# Patient Record
Sex: Male | Born: 1995 | Hispanic: Yes | Marital: Married | Smoking: Never smoker
Health system: Southern US, Community
[De-identification: ages and names within clinical notes are randomized; demographics above are authoritative.]

## PROBLEM LIST (undated history)

## (undated) DIAGNOSIS — F32A Depression, unspecified: Secondary | ICD-10-CM

## (undated) DIAGNOSIS — F329 Major depressive disorder, single episode, unspecified: Secondary | ICD-10-CM

## (undated) HISTORY — PX: KNEE SURGERY: SHX244

---
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
## 2016-08-26 ENCOUNTER — Emergency Department
Admission: EM | Admit: 2016-08-26 | Discharge: 2016-08-26 | Disposition: A | Discharge status: Home / Self Care | Payer: Medicaid Other | Attending: Emergency Medicine | Admitting: Emergency Medicine

## 2016-08-26 ENCOUNTER — Encounter: Payer: Self-pay | Admitting: Emergency Medicine

## 2016-08-26 DIAGNOSIS — J069 Acute upper respiratory infection, unspecified: Secondary | ICD-10-CM | POA: Insufficient documentation

## 2016-08-26 DIAGNOSIS — Z5321 Procedure and treatment not carried out due to patient leaving prior to being seen by health care provider: Secondary | ICD-10-CM | POA: Insufficient documentation

## 2016-08-26 NOTE — ED Triage Notes (Signed)
C/O URI symptoms over the weekend and felt nauseated this morning and had one episode of vomiting and diarrhea.  Patient states he needs a note to return to work.  AAOx3.  Skin warm and dry.  NAD

## 2016-09-10 ENCOUNTER — Emergency Department
Admission: EM | Admit: 2016-09-10 | Discharge: 2016-09-10 | Disposition: A | Discharge status: Home / Self Care | Payer: No Typology Code available for payment source | Attending: Emergency Medicine | Admitting: Emergency Medicine

## 2016-09-10 ENCOUNTER — Emergency Department: Payer: No Typology Code available for payment source

## 2016-09-10 ENCOUNTER — Encounter: Payer: Self-pay | Admitting: Emergency Medicine

## 2016-09-10 DIAGNOSIS — R51 Headache: Secondary | ICD-10-CM | POA: Diagnosis not present

## 2016-09-10 DIAGNOSIS — S199XXA Unspecified injury of neck, initial encounter: Secondary | ICD-10-CM | POA: Diagnosis present

## 2016-09-10 DIAGNOSIS — Y92410 Unspecified street and highway as the place of occurrence of the external cause: Secondary | ICD-10-CM | POA: Insufficient documentation

## 2016-09-10 DIAGNOSIS — M7918 Myalgia, other site: Secondary | ICD-10-CM

## 2016-09-10 DIAGNOSIS — Y9389 Activity, other specified: Secondary | ICD-10-CM | POA: Insufficient documentation

## 2016-09-10 DIAGNOSIS — S161XXA Strain of muscle, fascia and tendon at neck level, initial encounter: Secondary | ICD-10-CM

## 2016-09-10 DIAGNOSIS — Y999 Unspecified external cause status: Secondary | ICD-10-CM | POA: Insufficient documentation

## 2016-09-10 MED ORDER — IBUPROFEN 100 MG/5ML PO SUSP: 5.0000 mg/kg | Freq: Once | ORAL | Status: DC

## 2016-09-10 MED ORDER — CYCLOBENZAPRINE HCL 10 MG PO TABS
10.0000 mg | ORAL_TABLET | Freq: Once | ORAL | Status: AC
  Administered 2016-09-10: 10 mg via ORAL
  Filled 2016-09-10: qty 1

## 2016-09-10 MED ORDER — IBUPROFEN 400 MG PO TABS
ORAL_TABLET | ORAL | Status: AC
  Administered 2016-09-10: 400 mg via ORAL
  Filled 2016-09-10: qty 1

## 2016-09-10 MED ORDER — TRAMADOL HCL 50 MG PO TABS: 50.0000 mg | ORAL_TABLET | Freq: Four times a day (QID) | ORAL | 0 refills | Status: AC | PRN

## 2016-09-10 MED ORDER — TRAMADOL HCL 50 MG PO TABS
50.0000 mg | ORAL_TABLET | Freq: Once | ORAL | Status: AC
  Administered 2016-09-10: 50 mg via ORAL
  Filled 2016-09-10: qty 1

## 2016-09-10 MED ORDER — IBUPROFEN 400 MG PO TABS
400.0000 mg | ORAL_TABLET | Freq: Once | ORAL | Status: AC
  Administered 2016-09-10: 400 mg via ORAL
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
## 2016-10-25 ENCOUNTER — Encounter (HOSPITAL_COMMUNITY): Payer: Self-pay | Admitting: Emergency Medicine

## 2016-10-25 DIAGNOSIS — Y9372 Activity, wrestling: Secondary | ICD-10-CM | POA: Insufficient documentation

## 2016-10-25 DIAGNOSIS — Y999 Unspecified external cause status: Secondary | ICD-10-CM | POA: Insufficient documentation

## 2016-10-25 DIAGNOSIS — Y929 Unspecified place or not applicable: Secondary | ICD-10-CM | POA: Insufficient documentation

## 2016-10-25 DIAGNOSIS — M25561 Pain in right knee: Secondary | ICD-10-CM | POA: Insufficient documentation

## 2016-10-25 DIAGNOSIS — W500XXA Accidental hit or strike by another person, initial encounter: Secondary | ICD-10-CM | POA: Insufficient documentation

## 2016-10-25 NOTE — ED Notes (Signed)
Pt given ice pack

## 2016-10-25 NOTE — ED Triage Notes (Signed)
Pt st's he was play wrestling with a friend when the friend fell on his knee.  Pt c/o pain to right knee

## 2016-10-26 ENCOUNTER — Emergency Department (HOSPITAL_COMMUNITY)
Admission: EM | Admit: 2016-10-26 | Discharge: 2016-10-26 | Disposition: A | Discharge status: Home / Self Care | Payer: Medicaid Other | Attending: Emergency Medicine | Admitting: Emergency Medicine

## 2016-10-26 ENCOUNTER — Emergency Department (HOSPITAL_COMMUNITY): Payer: Medicaid Other

## 2016-10-26 DIAGNOSIS — M25561 Pain in right knee: Secondary | ICD-10-CM

## 2016-10-26 MED ORDER — IBUPROFEN 400 MG PO TABS
800.0000 mg | ORAL_TABLET | Freq: Once | ORAL | Status: AC
  Administered 2016-10-26: 800 mg via ORAL
  Filled 2016-10-26: qty 2

## 2016-10-26 MED ORDER — HYDROCODONE-ACETAMINOPHEN 5-325 MG PO TABS
1.0000 | ORAL_TABLET | Freq: Once | ORAL | Status: AC
  Administered 2016-10-26: 1 via ORAL
  Filled 2016-10-26: qty 1

## 2016-10-26 MED ORDER — IBUPROFEN 600 MG PO TABS: 600.0000 mg | ORAL_TABLET | Freq: Four times a day (QID) | ORAL | 0 refills | Status: AC | PRN

## 2016-10-26 MED ORDER — HYDROCODONE-ACETAMINOPHEN 5-325 MG PO TABS: 1.0000 | ORAL_TABLET | Freq: Four times a day (QID) | ORAL | 0 refills | Status: AC | PRN

## 2016-10-26 NOTE — ED Notes (Signed)
Pt verbalized understanding of d/c instructions and has no further questions. Pt is stable, A&Ox4, VSS.  

## 2016-10-26 NOTE — Discharge Instructions (Signed)
Take ibuprofen every 6 hours for pain. Apply ice to your knee 3-4 times per day. Wear a knee immobilizer for stability. You may take this off when showering. Use crutches to prevent from putting weight on your right leg. We advised close follow-up with an orthopedic specialist to ensure resolution of your symptoms. Call on Monday to schedule a follow-up appointment. You may take Norco as needed for severe pain. You may return for new or concerning symptoms.

## 2016-10-26 NOTE — ED Provider Notes (Signed)
[facility]-EMERGENCY DEPT Provider Note   CSN: [id number] Arrival date & time: 10/25/16  2337     History   Chief Complaint Chief Complaint  Patient presents with  . Knee Injury    HPI Edgar Stone is a 21 y.o. male.  21 year old male presents to the emergency department for evaluation of right knee pain. Symptoms began 2 hours ago while he was wrestling with his friend. He states that his friend fell on his knee and he has been experiencing pain since this time. He has tried applying an ice pack with little relief. No medications to him prior to arrival. He does report a history of knee sprain one week ago. No extremity numbness or paresthesias.      History reviewed. No pertinent past medical history.  There are no active problems to display for this patient.   History reviewed. No pertinent surgical history.     Home Medications    Prior to Admission medications   Medication Sig Start Date End Date Taking? Authorizing Provider  HYDROcodone-acetaminophen (NORCO/VICODIN) 5-325 MG tablet Take 1 tablet by mouth every 6 (six) hours as needed for severe pain. 10/26/16   Antony MaduraHumes, Marguerite Barba, PA-C  ibuprofen (ADVIL,MOTRIN) 600 MG tablet Take 1 tablet (600 mg total) by mouth every 6 (six) hours as needed. 10/26/16   Antony MaduraHumes, Sherisa Gilvin, PA-C  traMADol (ULTRAM) 50 MG tablet Take 1 tablet (50 mg total) by mouth every 6 (six) hours as needed for moderate pain. 09/10/16   Joni ReiningSmith, Ronald K, PA-C    Family History No family history on file.  Social History Social History  Substance Use Topics  . Smoking status: Never Smoker  . Smokeless tobacco: Never Used  . Alcohol use No     Allergies   Patient has no known allergies.   Review of Systems Review of Systems  Musculoskeletal: Positive for arthralgias and joint swelling.  Ten systems reviewed and are negative for acute change, except as noted in the HPI.    Physical Exam Updated Vital Signs BP 115/82 (BP Location: Left Arm)    Pulse 89   Temp 97.6 F (36.4 C) (Oral)   Resp 18   Ht 6' (1.829 m)   Wt 83 kg (183 lb)   SpO2 98%   BMI 24.82 kg/m   Physical Exam  Constitutional: He is oriented to person, place, and time. He appears well-developed and well-nourished. No distress.  Nontoxic appearing and in no acute distress  HENT:  Head: Normocephalic and atraumatic.  Eyes: Conjunctivae and EOM are normal. No scleral icterus.  Neck: Normal range of motion.  Cardiovascular: Normal rate, regular rhythm and intact distal pulses.   DP pulses 2+ in the right lower extremity  Pulmonary/Chest: Effort normal. No respiratory distress.  Respirations even and unlabored  Musculoskeletal: Normal range of motion.       Right knee: He exhibits swelling. He exhibits no effusion, no deformity, no erythema, normal alignment, no LCL laxity and no MCL laxity. Tenderness found. Medial joint line tenderness noted.  Range of motion of the right knee is preserved. There is mild swelling without effusion to the medial aspect of the knee. Tenderness to the posterior knee as well as along the medial joint line. No tenderness to the lateral joint line. No appreciable instability. No crepitus.  Neurological: He is alert and oriented to person, place, and time. He exhibits normal muscle tone. Coordination normal.  Sensation to light touch intact in the right lower extremity. Patient able to wiggle  all toes. He is able to weight-bear, but states this causes discomfort.  Skin: Skin is warm and dry. No rash noted. He is not diaphoretic. No erythema. No pallor.  Psychiatric: He has a normal mood and affect. His behavior is normal.  Nursing note and vitals reviewed.    ED Treatments / Results  Labs (all labs ordered are listed, but only abnormal results are displayed) Labs Reviewed - No data to display  EKG  EKG Interpretation None       Radiology Dg Knee Complete 4 Views Right  Result Date: 10/26/2016 CLINICAL DATA:  Play fighting  with a friend, fell and the friend landed on his knee. EXAM: RIGHT KNEE - COMPLETE 4+ VIEW COMPARISON:  None. FINDINGS: No evidence of fracture, dislocation, or joint effusion. No evidence of arthropathy or other focal bone abnormality. Soft tissues are unremarkable. IMPRESSION: Negative. Electronically Signed   By: Ellery Plunkaniel R Mitchell M.D.   On: 10/26/2016 00:24    Procedures Procedures (including critical care time)  Medications Ordered in ED Medications  ibuprofen (ADVIL,MOTRIN) tablet 800 mg (800 mg Oral Given 10/26/16 0109)  HYDROcodone-acetaminophen (NORCO/VICODIN) 5-325 MG per tablet 1 tablet (1 tablet Oral Given 10/26/16 0109)     Initial Impression / Assessment and Plan / ED Course  I have reviewed the triage vital signs and the nursing notes.  Pertinent labs & imaging results that were available during my care of the patient were reviewed by me and considered in my medical decision making (see chart for details).     21 year old male presents to the emergency department for right knee pain after his friend fell on his knee. Injury occurred approximately 12 hours prior to arrival. Patient neurovascularly intact on exam. No concern for septic joint. X-ray without findings of fracture, dislocation, or bony deformity. Suspect medial meniscal injury versus anterior cruciate ligament or PCL strain. Patient placed in knee immobilizer. Crutches given for weightbearing as tolerated. Will refer to orthopedics for follow-up. RICE and NSAIDs advised. Return precautions given. Patient discharged in stable condition with no unaddressed concerns.   Final Clinical Impressions(s) / ED Diagnoses   Final diagnoses:  Acute pain of right knee    New Prescriptions New Prescriptions   HYDROCODONE-ACETAMINOPHEN (NORCO/VICODIN) 5-325 MG TABLET    Take 1 tablet by mouth every 6 (six) hours as needed for severe pain.   IBUPROFEN (ADVIL,MOTRIN) 600 MG TABLET    Take 1 tablet (600 mg total) by mouth every 6  (six) hours as needed.     Antony MaduraHumes, Dorianna Mckiver, PA-C 10/26/16 0126    Zadie RhineWickline, Donald, MD 10/27/16 [phone]

## 2017-01-07 ENCOUNTER — Encounter: Payer: Self-pay | Admitting: Emergency Medicine

## 2017-01-07 ENCOUNTER — Emergency Department
Admission: EM | Admit: 2017-01-07 | Discharge: 2017-01-07 | Discharge status: Against Medical Advice | Payer: Medicaid Other | Attending: Student in an Organized Health Care Education/Training Program | Admitting: Student in an Organized Health Care Education/Training Program

## 2017-01-07 ENCOUNTER — Emergency Department: Payer: Medicaid Other

## 2017-01-07 DIAGNOSIS — R0602 Shortness of breath: Secondary | ICD-10-CM | POA: Insufficient documentation

## 2017-01-07 DIAGNOSIS — F419 Anxiety disorder, unspecified: Secondary | ICD-10-CM | POA: Diagnosis not present

## 2017-01-07 DIAGNOSIS — Z79899 Other long term (current) drug therapy: Secondary | ICD-10-CM | POA: Diagnosis not present

## 2017-01-07 DIAGNOSIS — R0789 Other chest pain: Secondary | ICD-10-CM | POA: Insufficient documentation

## 2017-01-07 DIAGNOSIS — H538 Other visual disturbances: Secondary | ICD-10-CM | POA: Diagnosis not present

## 2017-01-07 DIAGNOSIS — R55 Syncope and collapse: Secondary | ICD-10-CM | POA: Insufficient documentation

## 2017-01-07 DIAGNOSIS — R202 Paresthesia of skin: Secondary | ICD-10-CM | POA: Insufficient documentation

## 2017-01-07 LAB — COMPREHENSIVE METABOLIC PANEL
ALBUMIN: 4.9 g/dL (ref 3.5–5.0)
ALT: 23 U/L (ref 17–63)
ANION GAP: 10 (ref 5–15)
AST: 19 U/L (ref 15–41)
Alkaline Phosphatase: 80 U/L (ref 38–126)
BUN: 13 mg/dL (ref 6–20)
CO2: 27 mmol/L (ref 22–32)
Calcium: 9.4 mg/dL (ref 8.9–10.3)
Chloride: 101 mmol/L (ref 101–111)
Creatinine, Ser: 1.06 mg/dL (ref 0.61–1.24)
GFR calc Af Amer: >60 mL/min (ref >60)
GFR calc non Af Amer: >60 mL/min (ref >60)
GLUCOSE: 121 mg/dL — AB (ref 65–99)
POTASSIUM: 3.3 mmol/L — AB (ref 3.5–5.1)
Sodium: 138 mmol/L (ref 135–145)
Total Bilirubin: 0.9 mg/dL (ref 0.3–1.2)
Total Protein: 7.9 g/dL (ref 6.5–8.1)

## 2017-01-07 LAB — URINALYSIS, COMPLETE (UACMP) WITH MICROSCOPIC
Bacteria, UA: NONE SEEN
Bilirubin Urine: NEGATIVE
Glucose, UA: NEGATIVE mg/dL
Hgb urine dipstick: NEGATIVE
Ketones, ur: NEGATIVE mg/dL
Leukocytes, UA: NEGATIVE
Nitrite: NEGATIVE
PH: 6 (ref 5.0–8.0)
Protein, ur: NEGATIVE mg/dL
SPECIFIC GRAVITY, URINE: 1.02 (ref 1.005–1.030)
SQUAMOUS EPITHELIAL / LPF: NONE SEEN

## 2017-01-07 LAB — CBC
HCT: 49.3 % (ref 40.0–52.0)
Hemoglobin: 17.3 g/dL (ref 13.0–18.0)
MCH: 31 pg (ref 26.0–34.0)
MCHC: 35 g/dL (ref 32.0–36.0)
MCV: 88.6 fL (ref 80.0–100.0)
PLATELETS: 205 10*3/uL (ref 150–440)
RBC: 5.57 MIL/uL (ref 4.40–5.90)
RDW: 12.4 % (ref 11.5–14.5)
WBC: 14.2 10*3/uL — ABNORMAL HIGH (ref 3.8–10.6)

## 2017-01-07 LAB — TROPONIN I

## 2017-01-07 NOTE — ED Triage Notes (Addendum)
Pt reports went to get up from couch and passed out today.  Hands had been shaky feeling for couple hours prior to this.  C/o intermittent left sided mid axillary pains.  Was [facility] when passed out but not now.  Ambulatory to triage without difficulty. VSS. NAD. Pain radiates from left chest into shoulder and back per pt.

## 2017-01-07 NOTE — ED Notes (Signed)
Pt called for xray X 3.

## 2017-01-07 NOTE — ED Provider Notes (Signed)
[facility] Emergency Department Provider Note    First MD Initiated Contact with Patient 01/07/17 2130     (approximate)  I have reviewed the triage vital signs and the nursing notes.   HISTORY  Chief Complaint Loss of Consciousness    HPI Edgar Stone is a 21 y.o. male presents with chief complaint of a syncopal episode that occurred today. States it is feeling jittery and tingling all over for most of the day since he has been started on some anxiety but today started developing left-sided chest pain. States he was sitting watching TV and got up to get a drink of water but had blurry vision left-sided chest pain and then had a syncopal episode. We'll defer to the ground he started feeling better. There was associated shortness of breath but that resolved spontaneously. No fevers. No seizure-like activity. No family history of sudden cardiac death. Denies any shortness of breath at this moment but does still have some left-sided chest discomfort.   History reviewed. No pertinent past medical history. History reviewed. No pertinent family history. History reviewed. No pertinent surgical history. There are no active problems to display for this patient.     Prior to Admission medications   Medication Sig Start Date End Date Taking? Authorizing Provider  HYDROcodone-acetaminophen (NORCO/VICODIN) 5-325 MG tablet Take 1 tablet by mouth every 6 (six) hours as needed for severe pain. 10/26/16   Antony Madura, PA-C  ibuprofen (ADVIL,MOTRIN) 600 MG tablet Take 1 tablet (600 mg total) by mouth every 6 (six) hours as needed. 10/26/16   Antony Madura, PA-C  traMADol (ULTRAM) 50 MG tablet Take 1 tablet (50 mg total) by mouth every 6 (six) hours as needed for moderate pain. 09/10/16   Joni Reining, PA-C    Allergies Patient has no known allergies.    Social History Social History  Substance Use Topics  . Smoking status: Never Smoker  . Smokeless  tobacco: Never Used  . Alcohol use No    Review of Systems Patient denies headaches, rhinorrhea, blurry vision, numbness, shortness of breath, chest pain, edema, cough, abdominal pain, nausea, vomiting, diarrhea, dysuria, fevers, rashes or hallucinations unless otherwise stated above in HPI. ____________________________________________   PHYSICAL EXAM:  VITAL SIGNS: Vitals:   01/07/17 1821  BP: 129/82  Pulse: (!) 106  Resp: 16  Temp: 98.3 F (36.8 C)  SpO2: 98%    Constitutional: Alert and oriented. Well appearing and in no acute distress. Eyes: Conjunctivae are normal.  Head: Atraumatic. Nose: No congestion/rhinnorhea. Mouth/Throat: Mucous membranes are moist.   Neck: No stridor. Painless ROM.  Cardiovascular: Normal rate, regular rhythm. Grossly normal heart sounds.  Good peripheral circulation. Respiratory: Normal respiratory effort.  No retractions. Lungs CTAB. Gastrointestinal: Soft and nontender. No distention. No abdominal bruits. No CVA tenderness. Genitourinary:  Musculoskeletal: No lower extremity tenderness nor edema.  No joint effusions. Neurologic:  Normal speech and language. No gross focal neurologic deficits are appreciated. No facial droop Skin:  Skin is warm, dry and intact. No rash noted. Psychiatric: Mood and affect are normal. Speech and behavior are normal.  ____________________________________________   LABS (all labs ordered are listed, but only abnormal results are displayed)  Results for orders placed or performed during the hospital encounter of 01/07/17 (from the past 24 hour(s))  CBC     Status: Abnormal   Collection Time: 01/07/17  6:24 PM  Result Value Ref Range   WBC 14.2 (H) 3.8 - 10.6 K/uL   RBC  5.57 4.40 - 5.90 MIL/uL   Hemoglobin 17.3 13.0 - 18.0 g/dL   HCT 16.1 09.6 - 04.5 %   MCV 88.6 80.0 - 100.0 fL   MCH 31.0 26.0 - 34.0 pg   MCHC 35.0 32.0 - 36.0 g/dL   RDW 40.9 81.1 - 91.4 %   Platelets 205 150 - 440 K/uL  Troponin I      Status: None   Collection Time: 01/07/17  6:24 PM  Result Value Ref Range   Troponin I <0.03 <0.03 ng/mL  Comprehensive metabolic panel     Status: Abnormal   Collection Time: 01/07/17  6:24 PM  Result Value Ref Range   Sodium 138 135 - 145 mmol/L   Potassium 3.3 (L) 3.5 - 5.1 mmol/L   Chloride 101 101 - 111 mmol/L   CO2 27 22 - 32 mmol/L   Glucose, Bld 121 (H) 65 - 99 mg/dL   BUN 13 6 - 20 mg/dL   Creatinine, Ser 7.82 0.61 - 1.24 mg/dL   Calcium 9.4 8.9 - 95.6 mg/dL   Total Protein 7.9 6.5 - 8.1 g/dL   Albumin 4.9 3.5 - 5.0 g/dL   AST 19 15 - 41 U/L   ALT 23 17 - 63 U/L   Alkaline Phosphatase 80 38 - 126 U/L   Total Bilirubin 0.9 0.3 - 1.2 mg/dL   GFR calc non Af Amer >60 >60 mL/min   GFR calc Af Amer >60 >60 mL/min   Anion gap 10 5 - 15  Urinalysis, Complete w Microscopic     Status: Abnormal   Collection Time: 01/07/17  6:25 PM  Result Value Ref Range   Color, Urine YELLOW (A) YELLOW   APPearance CLEAR (A) CLEAR   Specific Gravity, Urine 1.020 1.005 - 1.030   pH 6.0 5.0 - 8.0   Glucose, UA NEGATIVE NEGATIVE mg/dL   Hgb urine dipstick NEGATIVE NEGATIVE   Bilirubin Urine NEGATIVE NEGATIVE   Ketones, ur NEGATIVE NEGATIVE mg/dL   Protein, ur NEGATIVE NEGATIVE mg/dL   Nitrite NEGATIVE NEGATIVE   Leukocytes, UA NEGATIVE NEGATIVE   RBC / HPF 0-5 0 - 5 RBC/hpf   WBC, UA 0-5 0 - 5 WBC/hpf   Bacteria, UA NONE SEEN NONE SEEN   Squamous Epithelial / LPF NONE SEEN NONE SEEN   Mucus PRESENT    ____________________________________________  EKG My review and personal interpretation at Time: 18:25   Indication: syncope  Rate: 105  Rhythm: sinus Axis: normal Other: normal intervals, no stemi, no wpw, no brugada ____________________________________________  RADIOLOGY  I personally reviewed all radiographic images ordered to evaluate for the above acute complaints and reviewed radiology reports and findings.  These findings were personally discussed with the patient.  Please see  medical record for radiology report.  ____________________________________________   PROCEDURES  Procedure(s) performed:  Procedures    Critical Care performed: no ____________________________________________   INITIAL IMPRESSION / ASSESSMENT AND PLAN / ED COURSE  Pertinent labs & imaging results that were available during my care of the patient were reviewed by me and considered in my medical decision making (see chart for details).  DDX: dehydration, orthostasis, uti, seizure, hocm, dysrhythmia, pe  Trig Jamisen Duerson is a 21 y.o. who presents to the ED with syncopal episode as described above. Patient is currently well-appearing. He is nonfocal neuro exam. EKG shows no evidence of preexcitation syndrome. His abdominal exam soft benign. He appears well-hydrated. May have some component of anxiety or hyperventilatory these syncopal episode but patient  is tachycardic with left-sided chest pain. I strongly recommended further workup for pulmonary embolism with d-dimer but the patient has declined this. I did relate my concern that not obtaining this blood work in the tissue ruling out PE with CT imaging could delay diagnosis of potentially life-threatening and harmful disease process. We discussed alternatives included lidocaine and other alternatives of obtaining IV access versus getting a straight stick for the blood test and then holding off on IV patient has declined this stating he rather follow up with his PCP. He demonstrates capacity understanding of the risks of not completing this workup and does demonstrate understanding of signs and symptoms to return to the ER.        ____________________________________________   FINAL CLINICAL IMPRESSION(S) / ED DIAGNOSES  Final diagnoses:  Syncope and collapse      NEW MEDICATIONS STARTED DURING THIS VISIT:  New Prescriptions   No medications on file     Note:  This document was prepared using Dragon voice recognition  software and may include unintentional dictation errors.    Willy Eddy, MD 01/07/17 2158

## 2017-01-07 NOTE — ED Notes (Signed)
Patient refusing any further services at this time. Cooperative with this RN to sign AMA paperwork. This RN and MD explained risks of leaving without further testing. Patient verbalized understanding and still refusing and wanting to leave. Information for outpatient followup provided by EDP and given to patient.

## 2017-01-07 NOTE — ED Notes (Signed)
Patient ambulatory to lobby with family with NAD noted.

## 2017-08-26 IMAGING — DX DG KNEE COMPLETE 4+V*R*
4 series · 4 of 4 positions shown · non-contrast
Comparison: None.

CLINICAL DATA: Play fighting with a friend, fell and the friend
landed on his knee.

EXAM:
RIGHT KNEE - COMPLETE 4+ VIEW

[knee ap]
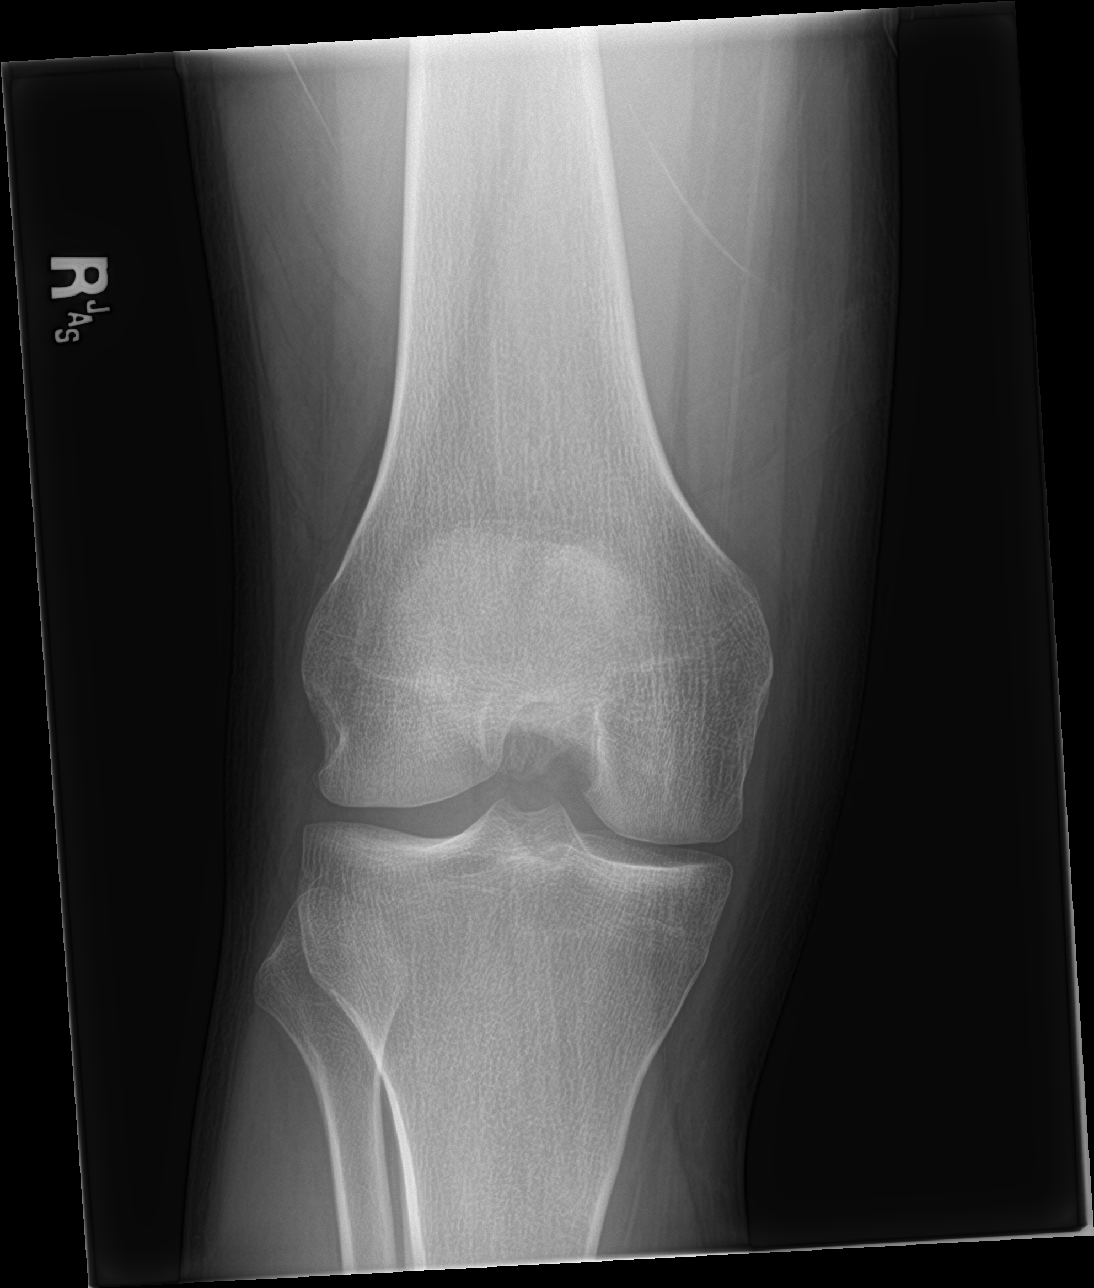

[knee lat]
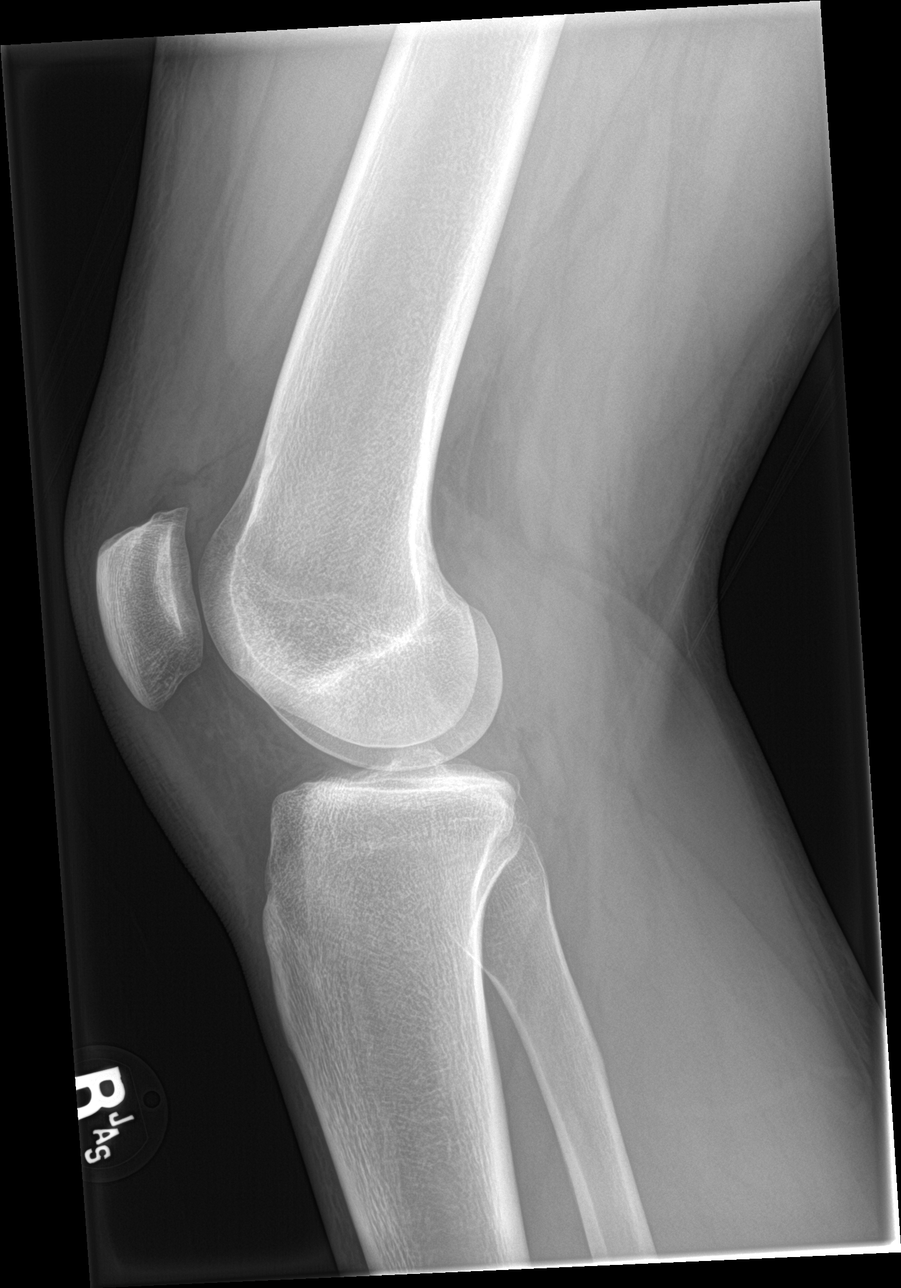

[knee obl (1 of 2)]
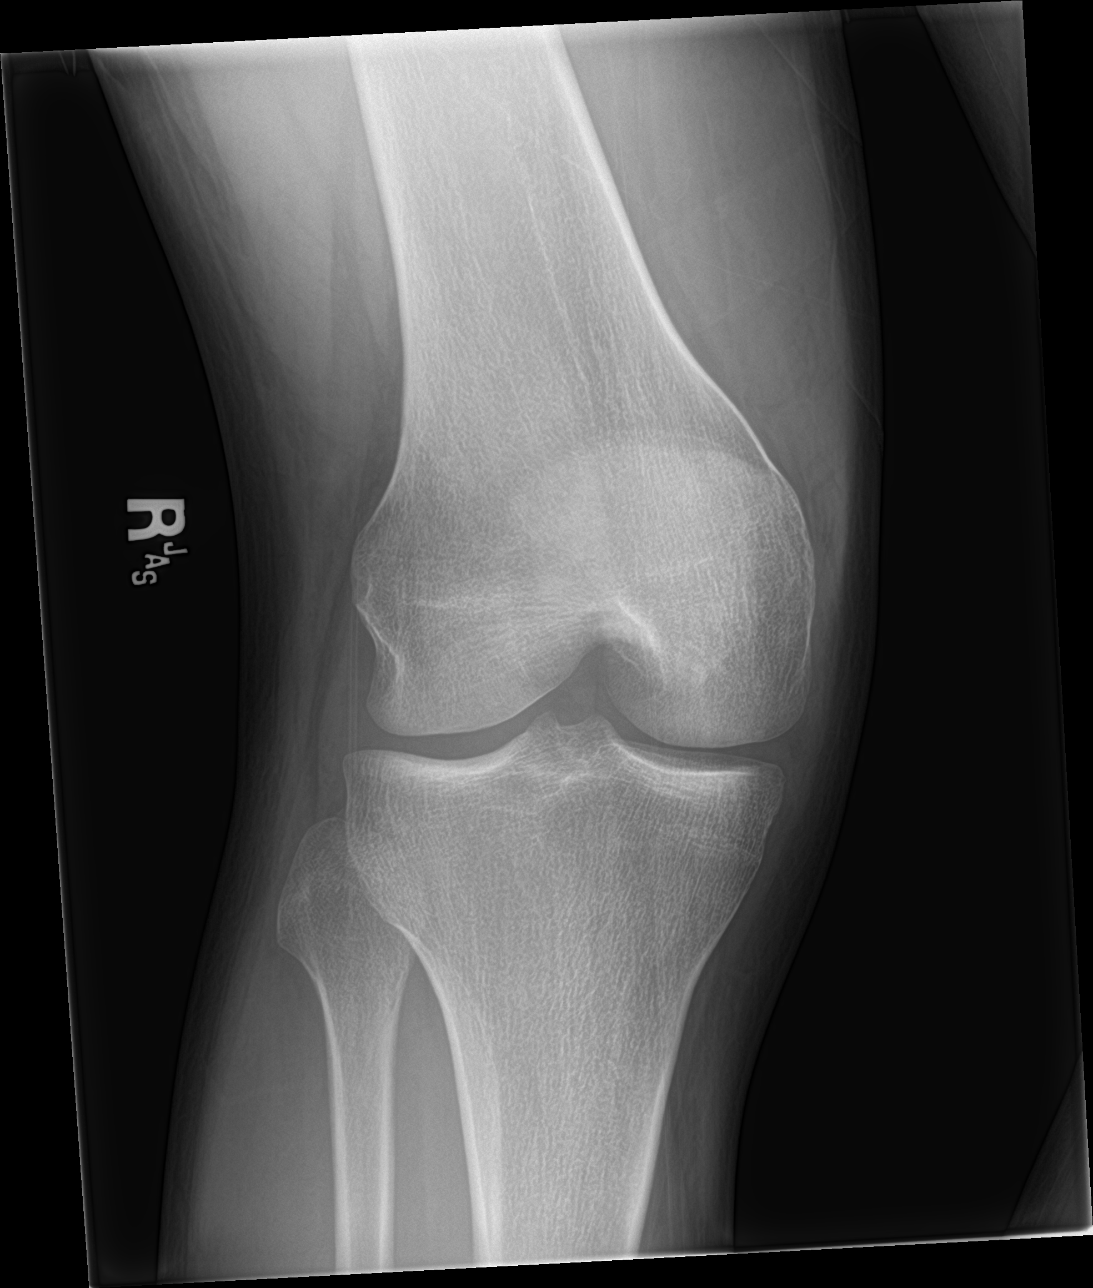

[knee obl (2 of 2)]
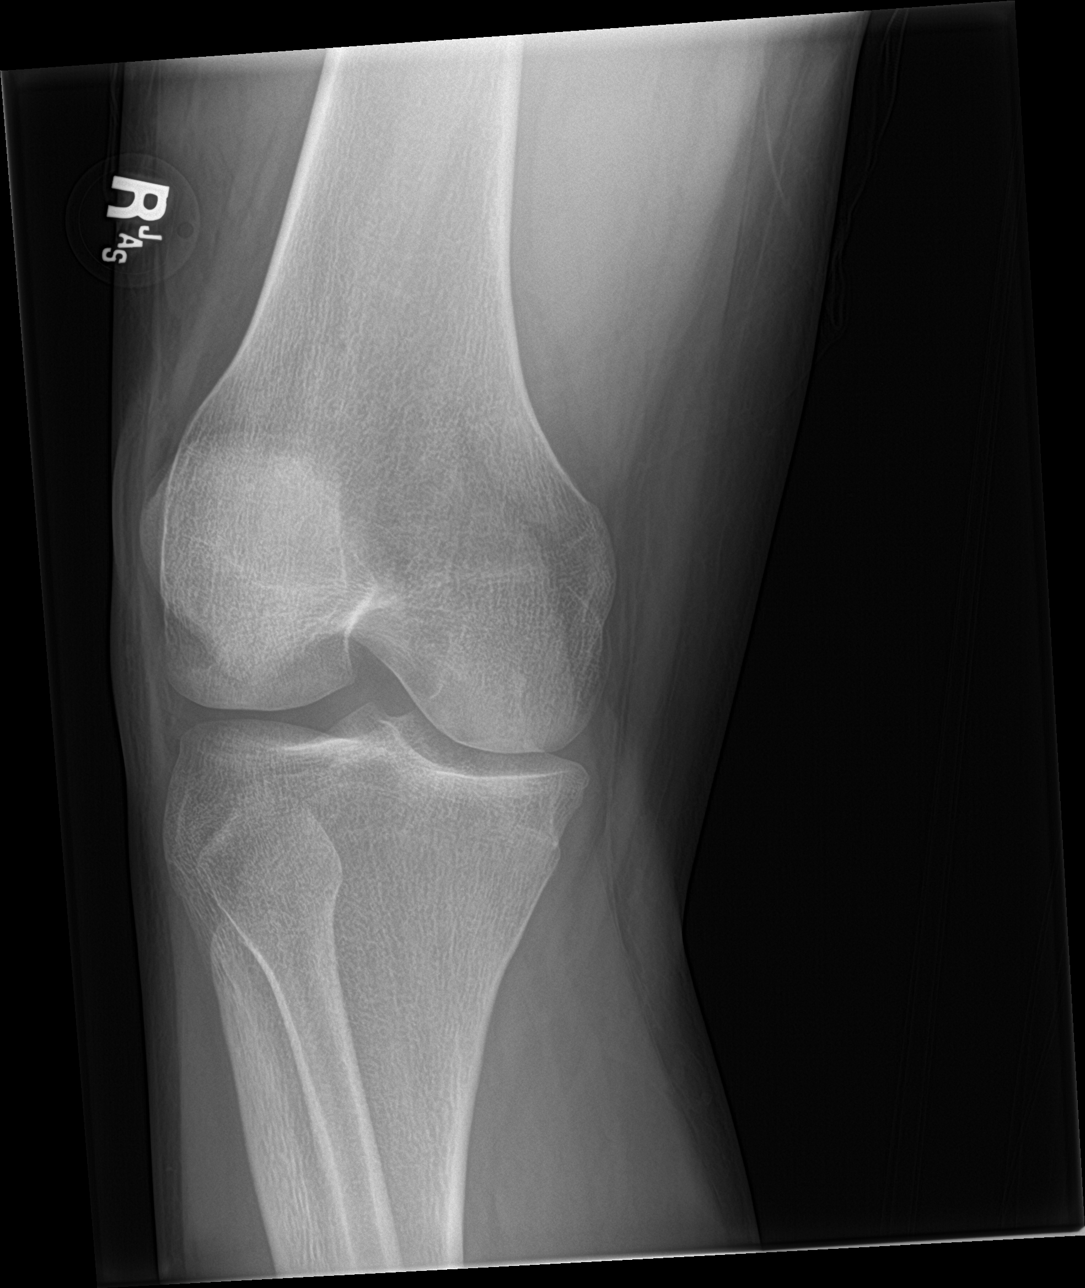

[4 of 4 positions shown; findings below may reference images not displayed]

FINDINGS: No evidence of fracture, dislocation, or joint effusion. No evidence
of arthropathy or other focal bone abnormality. Soft tissues are
unremarkable.
IMPRESSION: Negative.

## 2017-10-19 ENCOUNTER — Emergency Department
Admission: EM | Admit: 2017-10-19 | Discharge: 2017-10-19 | Disposition: A | Discharge status: Home / Self Care | Payer: Medicaid Other | Attending: Emergency Medicine | Admitting: Emergency Medicine

## 2017-10-19 ENCOUNTER — Other Ambulatory Visit: Payer: Self-pay

## 2017-10-19 DIAGNOSIS — Z79899 Other long term (current) drug therapy: Secondary | ICD-10-CM | POA: Diagnosis not present

## 2017-10-19 DIAGNOSIS — F331 Major depressive disorder, recurrent, moderate: Secondary | ICD-10-CM | POA: Diagnosis not present

## 2017-10-19 DIAGNOSIS — F1010 Alcohol abuse, uncomplicated: Secondary | ICD-10-CM

## 2017-10-19 DIAGNOSIS — F329 Major depressive disorder, single episode, unspecified: Secondary | ICD-10-CM | POA: Diagnosis present

## 2017-10-19 HISTORY — DX: Major depressive disorder, single episode, unspecified: F32.9

## 2017-10-19 HISTORY — DX: Depression, unspecified: F32.A

## 2017-10-19 LAB — URINE DRUG SCREEN, QUALITATIVE (ARMC ONLY)
AMPHETAMINES, UR SCREEN: NOT DETECTED
Benzodiazepine, Ur Scrn: NOT DETECTED
Cannabinoid 50 Ng, Ur ~~LOC~~: POSITIVE — AB
Cocaine Metabolite,Ur ~~LOC~~: POSITIVE — AB
MDMA (ECSTASY) UR SCREEN: NOT DETECTED
Methadone Scn, Ur: NOT DETECTED
Opiate, Ur Screen: NOT DETECTED
Phencyclidine (PCP) Ur S: NOT DETECTED
Tricyclic, Ur Screen: NOT DETECTED

## 2017-10-19 LAB — CBC
HEMATOCRIT: 46.4 % (ref 40.0–52.0)
HEMOGLOBIN: 16.4 g/dL (ref 13.0–18.0)
MCH: 31.9 pg (ref 26.0–34.0)
MCHC: 35.3 g/dL (ref 32.0–36.0)
MCV: 90.4 fL (ref 80.0–100.0)
Platelets: 205 10*3/uL (ref 150–440)
RBC: 5.13 MIL/uL (ref 4.40–5.90)
RDW: 13.1 % (ref 11.5–14.5)
WBC: 11.4 10*3/uL — AB (ref 3.8–10.6)

## 2017-10-19 LAB — COMPREHENSIVE METABOLIC PANEL
ALT: 28 U/L (ref 0–44)
AST: 33 U/L (ref 15–41)
Albumin: 4.6 g/dL (ref 3.5–5.0)
Alkaline Phosphatase: 84 U/L (ref 38–126)
Anion gap: 9 (ref 5–15)
BUN: 10 mg/dL (ref 6–20)
CHLORIDE: 108 mmol/L (ref 98–111)
CO2: 24 mmol/L (ref 22–32)
Calcium: 9.3 mg/dL (ref 8.9–10.3)
Creatinine, Ser: 0.83 mg/dL (ref 0.61–1.24)
GFR calc Af Amer: >60 mL/min (ref >60)
Glucose, Bld: 166 mg/dL — ABNORMAL HIGH (ref 70–99)
POTASSIUM: 3.2 mmol/L — AB (ref 3.5–5.1)
Sodium: 141 mmol/L (ref 135–145)
Total Bilirubin: 0.8 mg/dL (ref 0.3–1.2)
Total Protein: 7.4 g/dL (ref 6.5–8.1)

## 2017-10-19 LAB — ETHANOL

## 2017-10-19 LAB — SALICYLATE LEVEL: Salicylate Lvl: <7 mg/dL (ref 2.8–30.0)

## 2017-10-19 LAB — ACETAMINOPHEN LEVEL

## 2017-10-19 MED ORDER — ESCITALOPRAM OXALATE 10 MG PO TABS
10.0000 mg | ORAL_TABLET | Freq: Every day | ORAL | Status: DC
  Administered 2017-10-19: 10 mg via ORAL
  Filled 2017-10-19: qty 1

## 2017-10-19 MED ORDER — FLUOXETINE HCL 20 MG PO CAPS: 20.0000 mg | ORAL_CAPSULE | Freq: Every day | ORAL | 1 refills | Status: DC

## 2017-10-19 MED ORDER — FLUOXETINE HCL 20 MG PO CAPS: 20.0000 mg | ORAL_CAPSULE | Freq: Every day | ORAL | 1 refills | Status: AC

## 2017-10-19 NOTE — ED Notes (Signed)
Edgar Stone, [employer], [phone]  Pt requests call boss at 0730 to notified not coming in today "I'm in hospital"  Wife, Edgar FilbertMaria Stone, [phone]

## 2017-10-19 NOTE — ED Notes (Signed)
Pt given phone to use 

## 2017-10-19 NOTE — ED Provider Notes (Signed)
The patient has been evaluated at bedside by Dr. Toni Amendlapacs, psychiatry.  Patient is clinically stable.  Not felt to be a danger to self or others.  No SI or Hi.  No indication for inpatient psychiatric admission at this time.  Appropriate for continued outpatient therapy.    Willy Eddyobinson, Jairy Angulo, MD 10/19/17 1520

## 2017-10-19 NOTE — ED Provider Notes (Signed)
[facility] Emergency Department Provider Note  ____________________________________________   First MD Initiated Contact with Patient 10/19/17 [phone]     (approximate)  I have reviewed the triage vital signs and the nursing notes.   HISTORY  Chief Complaint Psychiatric Evaluation; Depression; Anxiety   HPI Edgar Stone is a 22 y.o. male who self presents the emergency department with suicidal ideation.  He has had intermittent depression and his current symptoms have been gradual onset slowly progressive.  He has no specific plan but has vague suicidal thoughts.  He is recently lost his job and his wife has been "talking to another man" which has brought him a significant amount of stress.  His symptoms are worsened by interpersonal conflict and somewhat improved with rest.  He voluntarily came to the emergency department today because "I did not want to do anything stupid".    Past Medical History:  Diagnosis Date  . Depression     There are no active problems to display for this patient.   No past surgical history on file.  Prior to Admission medications   Medication Sig Start Date End Date Taking? Authorizing Provider  hydrOXYzine (ATARAX/VISTARIL) 10 MG tablet Take 10 mg by mouth daily.   Yes [provider]  sertraline (ZOLOFT) 25 MG tablet Take 25-50 mg by mouth daily. Per Pt: Take one tablet by mouth daily, May take 2 tablets if feeling anxious.   Yes [provider]  HYDROcodone-acetaminophen (NORCO/VICODIN) 5-325 MG tablet Take 1 tablet by mouth every 6 (six) hours as needed for severe pain. Patient not taking: Reported on 10/19/2017 10/26/16   Antony MaduraHumes, Kelly, PA-C  ibuprofen (ADVIL,MOTRIN) 600 MG tablet Take 1 tablet (600 mg total) by mouth every 6 (six) hours as needed. Patient not taking: Reported on 10/19/2017 10/26/16   Antony MaduraHumes, Kelly, PA-C  traMADol (ULTRAM) 50 MG tablet Take 1 tablet (50 mg total) by mouth every 6  (six) hours as needed for moderate pain. Patient not taking: Reported on 10/19/2017 09/10/16   Joni ReiningSmith, Ronald K, PA-C    Allergies Patient has no known allergies.  No family history on file.  Social History Social History   Tobacco Use  . Smoking status: Never Smoker  . Smokeless tobacco: Never Used  Substance Use Topics  . Alcohol use: No  . Drug use: Yes    Types: Marijuana    Review of Systems Constitutional: No fever/chills Eyes: No visual changes. ENT: No sore throat. Cardiovascular: Denies chest pain. Respiratory: Denies shortness of breath. Gastrointestinal: No abdominal pain.  No nausea, no vomiting.  No diarrhea.  No constipation. Genitourinary: Negative for dysuria. Musculoskeletal: Negative for back pain. Skin: Negative for rash. Neurological: Negative for headaches, focal weakness or numbness.   ____________________________________________   PHYSICAL EXAM:  VITAL SIGNS: ED Triage Vitals [10/19/17 0428]  Enc Vitals Group     BP (!) 143/91     Pulse Rate 79     Resp 18     Temp 98 F (36.7 C)     Temp Source Oral     SpO2 99 %     Weight 188 lb (85.3 kg)     Height 6' (1.829 m)     Head Circumference      Peak Flow      Pain Score 0     Pain Loc      Pain Edu?      Excl. in GC?     Constitutional: Alert and oriented x4  sad affect nontoxic no diaphoresis Eyes: PERRL EOMI. Head: Atraumatic. Nose: No congestion/rhinnorhea. Mouth/Throat: No trismus Neck: No stridor.   Cardiovascular: Normal rate, regular rhythm. Grossly normal heart sounds.  Good peripheral circulation. Respiratory: Normal respiratory effort.  No retractions. Lungs CTAB and moving good air Gastrointestinal: Soft nontender Musculoskeletal: No lower extremity edema   Neurologic:  Normal speech and language. No gross focal neurologic deficits are appreciated. Skin:  Skin is warm, dry and intact. No rash noted. Psychiatric: Sad  affect   ____________________________________________   DIFFERENTIAL includes but not limited to  Suicidal ideation, depression, drug overdose, suicide attempt ____________________________________________   LABS (all labs ordered are listed, but only abnormal results are displayed)  Labs Reviewed  COMPREHENSIVE METABOLIC PANEL - Abnormal; Notable for the following components:      Result Value   Potassium 3.2 (*)    Glucose, Bld 166 (*)    All other components within normal limits  CBC - Abnormal; Notable for the following components:   WBC 11.4 (*)    All other components within normal limits  URINE DRUG SCREEN, QUALITATIVE ([facility] ONLY) - Abnormal; Notable for the following components:   Cocaine Metabolite,Ur [place] POSITIVE (*)    Cannabinoid 50 Ng, Ur [place] POSITIVE (*)    Barbiturates, Ur Screen   (*)    Value: Result not available. Reagent lot number recalled by manufacturer.   All other components within normal limits  ACETAMINOPHEN LEVEL - Abnormal; Notable for the following components:   Acetaminophen (Tylenol), Serum <10 (*)    All other components within normal limits  ETHANOL  SALICYLATE LEVEL    Lab work reviewed by me shows the patient is cocaine and cannabis positive __________________________________________  EKG   ____________________________________________  RADIOLOGY   ____________________________________________   PROCEDURES  Procedure(s) performed: no  Procedures  Critical Care performed: no  ____________________________________________   INITIAL IMPRESSION / ASSESSMENT AND PLAN / ED COURSE  Pertinent labs & imaging results that were available during my care of the patient were reviewed by me and considered in my medical decision making (see chart for details).   The patient self presents the emergency department with vague suicidal ideation.  In my opinion he does not meet involuntary commitment criteria as he is actively seeking help.  We  will reach out to specialist on-call.     ----------------------------------------- 7:50 AM on 10/19/2017 -----------------------------------------  Specialist on-call is recommending inpatient admission.  The patient has no indication for involuntary commitment and agrees to stay voluntary.  Recommendation made for trial of Lexapro 10 mg.  He is medically stable for psychiatric evaluation. ____________________________________________   FINAL CLINICAL IMPRESSION(S) / ED DIAGNOSES  Final diagnoses:  Moderate episode of recurrent major depressive disorder (HCC)      NEW MEDICATIONS STARTED DURING THIS VISIT:  New Prescriptions   No medications on file     Note:  This document was prepared using Dragon voice recognition software and may include unintentional dictation errors.     Merrily Brittle, MD 10/19/17 [phone]

## 2017-10-19 NOTE — ED Notes (Signed)
Dr. Toni Amendlapacs assessing patient.

## 2017-10-19 NOTE — ED Notes (Signed)
Pt dressed out into behavioral scrubs by Barbette HairNoel RN,

## 2017-10-19 NOTE — ED Notes (Signed)
Pt's wife called saying the pt called her to come get him. Wife will visit with pt per visiting hours policy. Pt advised he will not be leaving until MD determines plan of care. Pt verbalized understanding.

## 2017-10-19 NOTE — ED Notes (Signed)
This is a 22 y.o. Male; who presents to the Ed via his wife; for C/O having Depression; Anxiety; and having Suicidal thoughts; with plan to jump off a bridge; Secondary, to people talking about me; and a neighbor telling stories about me; I have really bad anxiety; I was walking around today contemplating suicide."

## 2017-10-19 NOTE — ED Notes (Signed)
Moved to BHU-7  

## 2017-10-19 NOTE — ED Notes (Signed)
Patient is speaking with S.O.C.  Dr. Mordecai MaesSanchez.

## 2017-10-19 NOTE — ED Notes (Signed)
Edgar PilesMaria Strickland Stone, wife, primary number for contact.  [phone]

## 2017-10-19 NOTE — ED Notes (Signed)
Patient dressed, ride called.

## 2017-10-19 NOTE — ED Notes (Signed)
Discharged to lobby with instructions and prescriptions waiting for wife to pick him up. Alert and calm.

## 2017-10-19 NOTE — ED Notes (Signed)
Pt refused shower at this time.

## 2017-10-19 NOTE — ED Triage Notes (Signed)
Patient having suicidal thoughts and feeling of hopelessness. Has lost his apartment, his car, as well as additional life stressors. Feels like he can't get ahead. Has been having thoughts of harming himself without plan. Patient presents with his wife and is tearful in triage while discussing his stressors.

## 2017-10-19 NOTE — Consult Note (Signed)
[facility] Face-to-Face Psychiatry Consult   Reason for Consult: Consult for this 22 year old man brought in by law enforcement after he contemplated jumping off a bridge Referring Physician: Rip Harbour Patient Identification: Edgar Stone MRN:  [id number] Principal Diagnosis: Moderate recurrent major depression ([place]) Diagnosis:   Patient Active Problem List   Diagnosis Date Noted  . Moderate recurrent major depression ([place]) [F33.1] 10/19/2017  . Alcohol abuse [F10.10] 10/19/2017    Total Time spent with patient: 1 hour  Subjective:   Edgar Stone is a 22 y.o. male patient admitted with "I had been arguing with my wife".  HPI: Patient seen chart reviewed.  Labs and vitals reviewed.  Patient was brought to the hospital after having an argument with his wife.  He says he has been more upset than the 2 of them of been arguing more recently.  He left the house in the middle the night feeling very depressed feeling worthless and negative about himself.  Walked around outside at night and stood on a bridge for a while thinking about killing himself but did not actually act on it.  He left the bridge and then called his wife on the phone and she arranged to have him brought to the hospital.  Patient says he has been depressed for several months now.  Mood stays down most of the time.  Sleep is erratic.  Appetite poor.  Energy level poor.  Denies any psychotic symptoms.  Patient has been to an outpatient provider and was prescribed citalopram and hydroxyzine and took them for a few weeks but did not feel that they were helping and just ran out of them.  He admits that he has been drinking more frequently drinking several drinks a day when he did not used to have a drinking problem.  Uses marijuana regularly which she does not see as a problem.  Used cocaine 1 time a couple days ago.  No psychotic symptoms.  Currently denies any suicidal intent or plan.  Social history: Lives with his wife.   Has a young child at home as well.  Has been working regularly.  Medical history: No significant medical problems  Substance abuse history: Patient says he uses marijuana regularly which she does not see as an issue.  Drug screen is also positive for cocaine which he says was a one-time use.  Negative screen for alcohol but he says his use has been increasing recently.  Past Psychiatric History: No previous hospitalization.  No previous suicide attempts.  Has seen a doctor and been prescribed citalopram and hydroxyzine but did not find them helpful.  No history of mania.  Risk to Self: Suicidal Ideation: Yes-Currently Present Suicidal Intent: (Denied) Is patient at risk for suicide?: No Suicidal Plan?: No-Not Currently/Within Last 6 Months Access to Means: Yes Specify Access to Suicidal Means: Access to bridges What has been your use of drugs/alcohol within the last 12 months?: daily use of marijuana, recent use of cocaine How many times?: 0 Other Self Harm Risks: denied Triggers for Past Attempts: None known Intentional Self Injurious Behavior: None Risk to Others: Homicidal Ideation: No Thoughts of Harm to Others: No Current Homicidal Intent: No Current Homicidal Plan: No Access to Homicidal Means: No Identified Victim: None identified History of harm to others?: No Assessment of Violence: None Noted Violent Behavior Description: denied Does patient have access to weapons?: No Criminal Charges Pending?: No Does patient have a court date: No Prior Inpatient Therapy: Prior Inpatient Therapy: No Prior Outpatient Therapy:  Prior Outpatient Therapy: No Does patient have an ACCT team?: No Does patient have Intensive In-House Services?  : No Does patient have [facility] services? : No Does patient have P4CC services?: No  Past Medical History:  Past Medical History:  Diagnosis Date  . Depression    No past surgical history on file. Family History: No family history on file. Family  Psychiatric  History: He says that he had an uncle who committed suicide Social History:  Social History   Substance and Sexual Activity  Alcohol Use No     Social History   Substance and Sexual Activity  Drug Use Yes  . Types: Marijuana    Social History   Socioeconomic History  . Marital status: Married    Spouse name: Not on file  . Number of children: Not on file  . Years of education: Not on file  . Highest education level: Not on file  Occupational History  . Not on file  Social Needs  . Financial resource strain: Not on file  . Food insecurity:    Worry: Not on file    Inability: Not on file  . Transportation needs:    Medical: Not on file    Non-medical: Not on file  Tobacco Use  . Smoking status: Never Smoker  . Smokeless tobacco: Never Used  Substance and Sexual Activity  . Alcohol use: No  . Drug use: Yes    Types: Marijuana  . Sexual activity: Not on file  Lifestyle  . Physical activity:    Days per week: Not on file    Minutes per session: Not on file  . Stress: Not on file  Relationships  . Social connections:    Talks on phone: Not on file    Gets together: Not on file    Attends religious service: Not on file    Active member of club or organization: Not on file    Attends meetings of clubs or organizations: Not on file    Relationship status: Not on file  Other Topics Concern  . Not on file  Social History Narrative  . Not on file   Additional Social History:    Allergies:  No Known Allergies  Labs:  Results for orders placed or performed during the hospital encounter of 10/19/17 (from the past 48 hour(s))  Urine Drug Screen, Qualitative     Status: Abnormal   Collection Time: 10/19/17  4:36 AM  Result Value Ref Range   Tricyclic, Ur Screen NONE DETECTED NONE DETECTED   Amphetamines, Ur Screen NONE DETECTED NONE DETECTED   MDMA (Ecstasy)Ur Screen NONE DETECTED NONE DETECTED   Cocaine Metabolite,Ur [place] POSITIVE (A) NONE DETECTED    Opiate, Ur Screen NONE DETECTED NONE DETECTED   Phencyclidine (PCP) Ur S NONE DETECTED NONE DETECTED   Cannabinoid 50 Ng, Ur [place] POSITIVE (A) NONE DETECTED   Barbiturates, Ur Screen (A) NONE DETECTED    Result not available. Reagent lot number recalled by manufacturer.   Benzodiazepine, Ur Scrn NONE DETECTED NONE DETECTED   Methadone Scn, Ur NONE DETECTED NONE DETECTED    Comment: (NOTE) Tricyclics + metabolites, urine    Cutoff 1000 ng/mL Amphetamines + metabolites, urine  Cutoff 1000 ng/mL MDMA (Ecstasy), urine              Cutoff 500 ng/mL Cocaine Metabolite, urine          Cutoff 300 ng/mL Opiate + metabolites, urine        Cutoff 300  ng/mL Phencyclidine (PCP), urine         Cutoff 25 ng/mL Cannabinoid, urine                 Cutoff 50 ng/mL Barbiturates + metabolites, urine  Cutoff 200 ng/mL Benzodiazepine, urine              Cutoff 200 ng/mL Methadone, urine                   Cutoff 300 ng/mL The urine drug screen provides only a preliminary, unconfirmed analytical test result and should not be used for non-medical purposes. Clinical consideration and professional judgment should be applied to any positive drug screen result due to possible interfering substances. A more specific alternate chemical method must be used in order to obtain a confirmed analytical result. Gas chromatography / mass spectrometry (GC/MS) is the preferred confirmat ory method. Performed at [facility], [address] [place], [place] [address]   Comprehensive metabolic panel     Status: Abnormal   Collection Time: 10/19/17  4:42 AM  Result Value Ref Range   Sodium 141 135 - 145 mmol/L   Potassium 3.2 (L) 3.5 - 5.1 mmol/L   Chloride 108 98 - 111 mmol/L    Comment: Please note change in reference range.   CO2 24 22 - 32 mmol/L   Glucose, Bld 166 (H) 70 - 99 mg/dL    Comment: Please note change in reference range.   BUN 10 6 - 20 mg/dL    Comment: Please note change in reference range.    Creatinine, Ser 0.83 0.61 - 1.24 mg/dL   Calcium 9.3 8.9 - 10.3 mg/dL   Total Protein 7.4 6.5 - 8.1 g/dL   Albumin 4.6 3.5 - 5.0 g/dL   AST 33 15 - 41 U/L   ALT 28 0 - 44 U/L    Comment: Please note change in reference range.   Alkaline Phosphatase 84 38 - 126 U/L   Total Bilirubin 0.8 0.3 - 1.2 mg/dL   GFR calc non Af Amer >60 >60 mL/min   GFR calc Af Amer >60 >60 mL/min    Comment: (NOTE) The eGFR has been calculated using the CKD EPI equation. This calculation has not been validated in all clinical situations. eGFR's persistently <60 mL/min signify possible Chronic Kidney Disease.    Anion gap 9 5 - 15    Comment: Performed at [facility], [address] [place], [place] [address]  Ethanol     Status: None   Collection Time: 10/19/17  4:42 AM  Result Value Ref Range   Alcohol, Ethyl (B) <10 <10 mg/dL    Comment: (NOTE) Lowest detectable limit for serum alcohol is 10 mg/dL. For medical purposes only. Performed at [facility], [address] [place], [place] [address]   cbc     Status: Abnormal   Collection Time: 10/19/17  4:42 AM  Result Value Ref Range   WBC 11.4 (H) 3.8 - 10.6 K/uL   RBC 5.13 4.40 - 5.90 MIL/uL   Hemoglobin 16.4 13.0 - 18.0 g/dL   HCT 46.4 40.0 - 52.0 %   MCV 90.4 80.0 - 100.0 fL   MCH 31.9 26.0 - 34.0 pg   MCHC 35.3 32.0 - 36.0 g/dL   RDW 13.1 11.5 - 14.5 %   Platelets 205 150 - 440 K/uL    Comment: Performed at [facility], [address] [place], [place] [address]  Acetaminophen level  Status: Abnormal   Collection Time: 10/19/17  4:42 AM  Result Value Ref Range   Acetaminophen (Tylenol), Serum <10 (L) 10 - 30 ug/mL    Comment: (NOTE) Therapeutic concentrations vary significantly. A range of 10-30 ug/mL  may be an effective concentration for many patients. However, some  are best treated at concentrations outside of this range. Acetaminophen concentrations >150 ug/mL at 4 hours after ingestion  and >50  ug/mL at 12 hours after ingestion are often associated with  toxic reactions. Performed at [facility], [address] [place], [place] [address]   Salicylate level     Status: None   Collection Time: 10/19/17  4:42 AM  Result Value Ref Range   Salicylate Lvl <6.0 2.8 - 30.0 mg/dL    Comment: Performed at [facility], [address] [place], [place] [address]    Current Facility-Administered Medications  Medication Dose Route Frequency Provider Last Rate Last Dose  . escitalopram (LEXAPRO) tablet 10 mg  10 mg Oral Daily Darel Hong, MD   10 mg at 10/19/17 8119   Current Outpatient Medications  Medication Sig Dispense Refill  . hydrOXYzine (ATARAX/VISTARIL) 10 MG tablet Take 10 mg by mouth daily.    . sertraline (ZOLOFT) 25 MG tablet Take 25-50 mg by mouth daily. Per Pt: Take one tablet by mouth daily, May take 2 tablets if feeling anxious.    [name] FLUoxetine (PROZAC) 20 MG capsule Take 1 capsule (20 mg total) by mouth daily. 30 capsule 1  . HYDROcodone-acetaminophen (NORCO/VICODIN) 5-325 MG tablet Take 1 tablet by mouth every 6 (six) hours as needed for severe pain. (Patient not taking: Reported on 10/19/2017) 7 tablet 0  . ibuprofen (ADVIL,MOTRIN) 600 MG tablet Take 1 tablet (600 mg total) by mouth every 6 (six) hours as needed. (Patient not taking: Reported on 10/19/2017) 30 tablet 0  . traMADol (ULTRAM) 50 MG tablet Take 1 tablet (50 mg total) by mouth every 6 (six) hours as needed for moderate pain. (Patient not taking: Reported on 10/19/2017) 12 tablet 0    Musculoskeletal: Strength & Muscle Tone: within normal limits Gait & Station: normal Patient leans: N/A  Psychiatric Specialty Exam: Physical Exam  Constitutional: He appears well-developed and well-nourished.  HENT:  Head: Normocephalic and atraumatic.  Eyes: Pupils are equal, round, and reactive to light. Conjunctivae are normal.  Neck: Normal range of motion.  Cardiovascular: Normal heart sounds.   Respiratory: Effort normal.  GI: Soft.  Musculoskeletal: Normal range of motion.  Neurological: He is alert.  Skin: Skin is warm and dry.  Psychiatric: His speech is normal and behavior is normal. Judgment and thought content normal. Cognition and memory are normal. He exhibits a depressed mood.    Review of Systems  Constitutional: Negative.   HENT: Negative.   Eyes: Negative.   Respiratory: Negative.   Cardiovascular: Negative.   Gastrointestinal: Negative.   Musculoskeletal: Negative.   Skin: Negative.   Neurological: Negative.   Psychiatric/Behavioral: Positive for depression and substance abuse. Negative for hallucinations, memory loss and suicidal ideas. The patient is not nervous/anxious and does not have insomnia.     Blood pressure 117/78, pulse 79, temperature 98 F (36.7 C), temperature source Oral, resp. rate 18, height 6' (1.829 m), weight 85.3 kg (188 lb), SpO2 100 %.Body mass index is 25.5 kg/m.  General Appearance: Casual  Eye Contact:  Good  Speech:  Clear and Coherent  Volume:  Normal  Mood:  Euthymic  Affect:  Constricted  Thought Process:  Goal Directed  Orientation:  Full (Time, Place, and Person)  Thought Content:  Logical  Suicidal Thoughts:  No  Homicidal Thoughts:  No  Memory:  Immediate;   Fair Recent;   Fair Remote;   Fair  Judgement:  Fair  Insight:  Fair  Psychomotor Activity:  Normal  Concentration:  Concentration: Fair  Recall:  [employer] of Knowledge:  Fair  Language:  Fair  Akathisia:  No  Handed:  Right  AIMS (if indicated):     Assets:  Communication Skills Desire for Improvement Financial Resources/Insurance Housing Physical Health Resilience Social Support  ADL's:  Intact  Cognition:  WNL  Sleep:        Treatment Plan Summary: Daily contact with patient to assess and evaluate symptoms and progress in treatment, Medication management and Plan 22 year old man with major depression of moderate severity no psychosis.  Not  currently suicidal.  Good insight and agrees to treatment plan.  Recommend starting fluoxetine 20 mg a day.  Side effects discussed.  Patient agreeable to plan.  Prescription printed.  Patient otherwise can be discharged from the emergency room and encouraged to continue following up at [facility].  Case reviewed with ER doctor and TTS and nursing  Disposition: No evidence of imminent risk to self or others at present.   Patient does not meet criteria for psychiatric inpatient admission. Supportive therapy provided about ongoing stressors. Discussed crisis plan, support from social network, calling 911, coming to the Emergency Department, and calling Suicide Hotline.  Alethia Berthold, MD 10/19/2017 3:58 PM
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
## 2018-01-18 ENCOUNTER — Other Ambulatory Visit: Payer: Self-pay | Admitting: Psychiatry

## 2018-03-08 ENCOUNTER — Other Ambulatory Visit: Payer: Self-pay | Admitting: Psychiatry

## 2019-02-14 ENCOUNTER — Emergency Department (HOSPITAL_COMMUNITY)
Admission: EM | Admit: 2019-02-14 | Discharge: 2019-02-15 | Discharge status: Against Medical Advice | Payer: Medicaid Other

## 2020-06-06 ENCOUNTER — Ambulatory Visit (HOSPITAL_COMMUNITY)
Admission: EM | Admit: 2020-06-06 | Discharge: 2020-06-06 | Disposition: A | Discharge status: Home / Self Care | Payer: No Payment, Other | Attending: Psychiatry | Admitting: Psychiatry

## 2020-06-06 ENCOUNTER — Other Ambulatory Visit: Payer: Self-pay

## 2020-06-06 DIAGNOSIS — F6381 Intermittent explosive disorder: Secondary | ICD-10-CM | POA: Diagnosis not present

## 2020-06-06 DIAGNOSIS — F411 Generalized anxiety disorder: Secondary | ICD-10-CM | POA: Diagnosis not present
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
## 2020-06-06 NOTE — BH Assessment (Signed)
Pt presented as a referral from upstairs/outpatient where he was a walk-in.  Pt triaged.  He reported significant anxiety with accompanying chest pressure over the last two weeks.  Pt stated that he has had suicidal ideation (without plan) over the last two weeks, although not currently.  Likewise he stated that when he is anxious, he has wanted to hurt people (''whoever is around me.'').  Denied current HI.  Pt endorsed visual hallucinations -- a sense that things are moving.  He also reported daily use of marijuana.  Last use was 06/05/2020.    Level of care -- urgent.

## 2020-06-06 NOTE — Discharge Summary (Signed)
Edgar Stone to be D/C'd home per NP order. Discussed with the patient and all questions fully answered. An After Visit Summary was printed and given to the patient.  Patient escorted out, and D/C home via private auto.  Dickie La  06/06/2020 10:42 AM

## 2020-06-06 NOTE — Discharge Instructions (Addendum)
Please come to [facility] (this facility) during walk in hours for appointment with psychiatrist for further medication management and for therapy.   Walk in hours are 8-11 AM Monday through Thursday for medication management.It is first come, first -serve; it is best to arrive by 7:00 AM. On Friday from 1 pm to 4 pm for therapy intake only. Please arrive by 12:00 pm as it is  first come, first -serve.   When you arrive please go upstairs for your appointment. If you are unsure of where to go, inform the front desk that you are here for a walk in appointment and they will assist you with directions upstairs.  Address:  [address], in [place], [address] Ph: [phone]   

## 2020-06-13 ENCOUNTER — Ambulatory Visit (INDEPENDENT_AMBULATORY_CARE_PROVIDER_SITE_OTHER): Payer: No Payment, Other | Admitting: Clinical

## 2020-06-13 ENCOUNTER — Other Ambulatory Visit: Payer: Self-pay

## 2020-06-13 DIAGNOSIS — F331 Major depressive disorder, recurrent, moderate: Secondary | ICD-10-CM

## 2020-06-15 NOTE — Progress Notes (Signed)
   THERAPIST PROGRESS NOTE  Session Time: 35 minutes  Participation Level: Active  Behavioral Response: CasualAlertDepressed  Type of Therapy: Individual Therapy  Treatment Goals addressed: Diagnosis: depression  Interventions: CBT  Summary:  Edgar Stone is a 25 y.o. male who presents as a walk in to [facility] for outpatient behavioral health services. Client presented oriented times five, appropriately dressed, friendly, and cooperative. Client denied current hallucinations and delusions. Client presents for initial appointment at [facility] for therapy and psychiatry services.  Client was seen on 06/06/20 at [facility] urgent care for anxiety and irritability. Client reported he previously received outpatient therapy and medication from [facility] in [place]. Client reported a history of endorsed symptoms that include "overwhelming anxiety, depressed mood, anger, and auditory/ visual hallucinations. Client stated, "I get very overwhelmed, and I don't know why". Client described his symptoms as "I don't like going out anywhere, heart pounding out of his chest, can't breathe, I feel like I can't do normal stuff because I have hard time being around other people, I'm paranoid and don't know why even when there is no reason". Client stated, "it takes a lot out of me to speak to people, I can't bring myself to speak to someone". Client did not care to elaborate on his mention of hallucinations. Client reported trauma during childhood but declined to elaborate at this time. Client reported these symptoms have negatively affected his relationship and his work history. Client reported his anger usually is geared towards his own things and not other people. Client denied any legal trouble due to anger. Client reported he has been tried on Sertraline and Zoloft in the past but did not think they were effective for him. Client reported he is unsure about family history of confirmed mental health diagnosis.  Client reported using marijuana daily. Client was screened for nutrition and pain assessment with the following SDOH:  GAD 7 : Generalized Anxiety Score 06/13/2020  Nervous, Anxious, on Edge 3  Control/stop worrying 3  Worry too much - different things 3  Trouble relaxing 3  Restless 3  Easily annoyed or irritable 3  Afraid - awful might happen 3  Total GAD 7 Score 21  Anxiety Difficulty Extremely difficult   Flowsheet Row Counselor from 06/13/2020 in [facility]  PHQ-2 Total Score 6     Flowsheet Row Counselor from 06/13/2020 in [facility]  PHQ-9 Total Score 19      Suicidal/Homicidal: Nowithout intent/plan  Therapist Response:  Therapist began the session making introduction and discussing confidentiality. Therapist engaged with the client to ask open ended questions about his biopsychosocial history. Therapist asked open ended questions about current endorsed symptoms. Therapist assessed for SDOH. Therapist used CBT to discuss goals for treatment. Therapist addressed questions and concerns. Client was scheduled for next appointments.    Plan: Return again in 5 weeks for individual therapy. Client was scheduled for psychiatric evaluation for medication management.  Diagnosis: Major depressive disorder, recurrent episode, moderate w/ anxious distress   Neena Rhymes Rosaire Cueto, LCSW 06/13/2020

## 2020-06-19 ENCOUNTER — Other Ambulatory Visit: Payer: Self-pay

## 2020-06-19 ENCOUNTER — Ambulatory Visit (INDEPENDENT_AMBULATORY_CARE_PROVIDER_SITE_OTHER): Payer: No Payment, Other | Admitting: Psychiatry

## 2020-06-19 ENCOUNTER — Encounter (HOSPITAL_COMMUNITY): Payer: Self-pay | Admitting: Psychiatry

## 2020-06-19 DIAGNOSIS — F1994 Other psychoactive substance use, unspecified with psychoactive substance-induced mood disorder: Secondary | ICD-10-CM | POA: Diagnosis not present

## 2020-06-19 DIAGNOSIS — F1210 Cannabis abuse, uncomplicated: Secondary | ICD-10-CM | POA: Insufficient documentation

## 2020-06-19 DIAGNOSIS — F12188 Cannabis abuse with other cannabis-induced disorder: Secondary | ICD-10-CM

## 2020-06-19 MED ORDER — QUETIAPINE FUMARATE 100 MG PO TABS: 100.0000 mg | ORAL_TABLET | Freq: Every day | ORAL | 2 refills | Status: AC
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
## 2020-07-01 ENCOUNTER — Encounter (HOSPITAL_COMMUNITY): Payer: Self-pay | Admitting: *Deleted

## 2020-07-01 ENCOUNTER — Ambulatory Visit (HOSPITAL_COMMUNITY)
Admission: EM | Admit: 2020-07-01 | Discharge: 2020-07-01 | Disposition: A | Discharge status: Home / Self Care | Payer: PRIVATE HEALTH INSURANCE | Attending: Student | Admitting: Student

## 2020-07-01 ENCOUNTER — Other Ambulatory Visit: Payer: Self-pay

## 2020-07-01 DIAGNOSIS — A084 Viral intestinal infection, unspecified: Secondary | ICD-10-CM | POA: Diagnosis not present

## 2020-07-01 DIAGNOSIS — G44209 Tension-type headache, unspecified, not intractable: Secondary | ICD-10-CM

## 2020-07-01 DIAGNOSIS — R591 Generalized enlarged lymph nodes: Secondary | ICD-10-CM | POA: Diagnosis not present

## 2020-07-01 MED ORDER — PREDNISONE 20 MG PO TABS: 40.0000 mg | ORAL_TABLET | Freq: Every day | ORAL | 0 refills | Status: AC

## 2020-07-01 MED ORDER — KETOROLAC TROMETHAMINE 60 MG/2ML IM SOLN
INTRAMUSCULAR | Status: AC
  Filled 2020-07-01: qty 2

## 2020-07-01 MED ORDER — ONDANSETRON 8 MG PO TBDP: 8.0000 mg | ORAL_TABLET | Freq: Three times a day (TID) | ORAL | 0 refills | Status: AC | PRN

## 2020-07-01 MED ORDER — KETOROLAC TROMETHAMINE 60 MG/2ML IM SOLN
60.0000 mg | Freq: Once | INTRAMUSCULAR | Status: AC
Start: 2020-07-01 — End: 2020-07-01
  Administered 2020-07-01: 60 mg via INTRAMUSCULAR

## 2020-07-01 NOTE — ED Provider Notes (Signed)
[facility]    CSN: [id number] Arrival date & time: 07/01/20  1625      History   Chief Complaint Chief Complaint  Patient presents with  . Knot on Neck  . Neck Pain  . Headache    HPI Edgar Stone is a 25 y.o. male presenting for multiple complaints including nausea with vomiting, lymphadenopathy, headaches.  Medical history includes substance abuse induced mood disorder, cannabis hyperemesis syndrome, alcohol abuse, depression. This patient is a poor historian. -Notes 5 days of left-sided neck pain and swollen lymph node.  States pain seems to be radiating out from the lymph node and extending towards his left ear.  Denies ear pain, hearing changes, discharge from ear, dizziness, tinnitus. Denies diarrhea, shortness of breath, chest pain, cough, congestion, facial pain, teeth pain, sore throat, loss of taste/smell. Denies back pain -Notes 3 days of nausea with vomiting, generalized crampy abdominal pain, bodyaches.  States he is vomiting 2-3 times daily and is not able to keep solid foods down.  States he is able to keep fluids down and is hydrating well by mouth.  Endorses subjective chills for which he took Tylenol 5 hours ago, states he has not taken any NSAIDs today.  Denies diarrhea and states that his last bowel movement was this morning and was normal, still passing gas.   -Few days of throbbing headache behind forehead and left cheek. Denies worst headache of life, thunderclap headache, weakness/sensation changes in arms/legs, vision changes, shortness of breath, chest pain/pressure, photophobia, phonophobia, n/v/d.     HPI  Past Medical History:  Diagnosis Date  . Depression     Patient Active Problem List   Diagnosis Date Noted  . Substance induced mood disorder (HCC) 06/19/2020  . Cannabis hyperemesis syndrome concurrent with and due to cannabis abuse (HCC) 06/19/2020  . Moderate recurrent major depression (HCC) 10/19/2017  . Alcohol abuse  10/19/2017    Past Surgical History:  Procedure Laterality Date  . KNEE SURGERY         Home Medications    Prior to Admission medications   Medication Sig Start Date End Date Taking? Authorizing Provider  ondansetron (ZOFRAN ODT) 8 MG disintegrating tablet Take 1 tablet (8 mg total) by mouth every 8 (eight) hours as needed for nausea or vomiting. 07/01/20  Yes Edgar Martini, PA-C  predniSONE (DELTASONE) 20 MG tablet Take 2 tablets (40 mg total) by mouth daily for 5 days. 07/01/20 07/06/20 Yes Edgar Martini, PA-C  FLUoxetine (PROZAC) 20 MG capsule Take 1 capsule (20 mg total) by mouth daily. 10/19/17 10/19/18  Clapacs, Jackquline Denmark, MD  HYDROcodone-acetaminophen (NORCO/VICODIN) 5-325 MG tablet Take 1 tablet by mouth every 6 (six) hours as needed for severe pain. Patient not taking: No sig reported 10/26/16   Antony Madura, PA-C  hydrOXYzine (ATARAX/VISTARIL) 10 MG tablet Take 10 mg by mouth daily.    [provider]  ibuprofen (ADVIL,MOTRIN) 600 MG tablet Take 1 tablet (600 mg total) by mouth every 6 (six) hours as needed. Patient not taking: No sig reported 10/26/16   Antony Madura, PA-C  QUEtiapine (SEROQUEL) 100 MG tablet Take 1 tablet (100 mg total) by mouth at bedtime. 06/19/20   Shanna Cisco, NP  sertraline (ZOLOFT) 25 MG tablet Take 25-50 mg by mouth daily. Per Pt: Take one tablet by mouth daily, May take 2 tablets if feeling anxious.    [provider]  traMADol (ULTRAM) 50 MG tablet Take 1 tablet (50 mg total) by  mouth every 6 (six) hours as needed for moderate pain. Patient not taking: No sig reported 09/10/16   Joni Reining, PA-C    Family History Family History  Problem Relation Age of Onset  . Diabetes Mother   . Thyroid disease Mother   . Healthy Father     Social History Social History   Tobacco Use  . Smoking status: Never Smoker  . Smokeless tobacco: Never Used  Vaping Use  . Vaping Use: Never used  Substance Use Topics  . Alcohol use: No  .  Drug use: Yes    Types: Marijuana     Allergies   Patient has no known allergies.   Review of Systems Review of Systems  Constitutional: Negative for appetite change, chills, diaphoresis, fatigue, fever and unexpected weight change.  HENT: Negative for congestion, ear pain, sinus pressure, sinus pain, sneezing, sore throat, trouble swallowing and voice change.   Eyes: Negative for photophobia, pain, discharge, redness, itching and visual disturbance.  Respiratory: Negative for cough, chest tightness and shortness of breath.   Cardiovascular: Negative for chest pain, palpitations and leg swelling.  Gastrointestinal: Positive for abdominal pain, nausea and vomiting. Negative for abdominal distention, anal bleeding, blood in stool, constipation, diarrhea and rectal pain.  Genitourinary: Negative for dysuria, flank pain, frequency and urgency.  Musculoskeletal: Positive for neck pain. Negative for back pain, gait problem, myalgias and neck stiffness.  Neurological: Positive for headaches. Negative for dizziness, tremors, seizures, syncope, facial asymmetry, speech difficulty, weakness, light-headedness and numbness.  Psychiatric/Behavioral: Negative for agitation, decreased concentration, dysphoric mood, hallucinations and suicidal ideas. The patient is not nervous/anxious.   All other systems reviewed and are negative.    Physical Exam Triage Vital Signs ED Triage Vitals  Enc Vitals Group     BP 07/01/20 1638 (!) 143/84     Pulse Rate 07/01/20 1638 78     Resp 07/01/20 1638 16     Temp 07/01/20 1638 98.3 F (36.8 C)     Temp Source 07/01/20 1638 Temporal     SpO2 07/01/20 1638 97 %     Weight --      Height --      Head Circumference --      Peak Flow --      Pain Score 07/01/20 1639 7     Pain Loc --      Pain Edu? --      Excl. in GC? --    No data found.  Updated Vital Signs BP (!) 143/84   Pulse 78   Temp 98.3 F (36.8 C) (Temporal)   Resp 16   SpO2 97%    Visual Acuity Right Eye Distance:   Left Eye Distance:   Bilateral Distance:    Right Eye Near:   Left Eye Near:    Bilateral Near:     Physical Exam Vitals reviewed.  Constitutional:      General: He is not in acute distress.    Appearance: Normal appearance. He is not ill-appearing.  HENT:     Head: Normocephalic and atraumatic.     Mouth/Throat:     Mouth: Mucous membranes are moist.     Comments: Moist mucous membranes Eyes:     Extraocular Movements: Extraocular movements intact.     Pupils: Pupils are equal, round, and reactive to light.     Comments: PERRLA, EOMI  Neck:     Meningeal: Brudzinski's sign and Kernig's sign absent.   Cardiovascular:  Rate and Rhythm: Normal rate and regular rhythm.     Heart sounds: Normal heart sounds.  Pulmonary:     Effort: Pulmonary effort is normal.     Breath sounds: Normal breath sounds. No wheezing, rhonchi or rales.  Abdominal:     General: Bowel sounds are normal. There is no distension.     Palpations: Abdomen is soft. There is no mass.     Tenderness: There is no abdominal tenderness. There is no right CVA tenderness, left CVA tenderness, guarding or rebound.  Musculoskeletal:     Cervical back: Normal range of motion and neck supple. No rigidity.  Lymphadenopathy:     Cervical: Cervical adenopathy present.     Right cervical: No superficial, deep or posterior cervical adenopathy.    Left cervical: Posterior cervical adenopathy present. No superficial or deep cervical adenopathy.  Skin:    General: Skin is warm.     Capillary Refill: Capillary refill takes less than 2 seconds.     Comments: Good skin turgor  Neurological:     General: No focal deficit present.     Mental Status: He is alert and oriented to person, place, and time. Mental status is at baseline.     Cranial Nerves: Cranial nerves are intact. No cranial nerve deficit or facial asymmetry.     Sensory: Sensation is intact. No sensory deficit.      Motor: Motor function is intact. No weakness.     Coordination: Coordination is intact. Romberg sign negative. Coordination normal.     Gait: Gait is intact. Gait normal.     Comments: CN 2-12 intact. No weakness or numbness in UEs or LEs, strength 5/5 in UEs and LEs.   Psychiatric:        Mood and Affect: Mood normal. Affect is flat.        Speech: Speech normal.        Behavior: Behavior normal.        Thought Content: Thought content normal.        Judgment: Judgment normal.      UC Treatments / Results  Labs (all labs ordered are listed, but only abnormal results are displayed) Labs Reviewed - No data to display  EKG   Radiology No results found.  Procedures Procedures (including critical care time)  Medications Ordered in UC Medications  ketorolac (TORADOL) injection 60 mg (has no administration in time range)    Initial Impression / Assessment and Plan / UC Course  I have reviewed the triage vital signs and the nursing notes.  Pertinent labs & imaging results that were available during my care of the patient were reviewed by me and considered in my medical decision making (see chart for details).     This patient is a 25 year old male presenting with viral gastroenteritis, lymphadenopathy, tension headache. Today this pt is afebrile nontachycardic nontachypneic, oxygenating well on room air, no wheezes rhonchi or rales. Neuro exam benign, negative Brudzinski.  Last dose of antipyretic was 5 hours ago. Appears well hydrated.   Zofran ODT sent for nausea and vomiting.  Recommended avoiding marijuana during this episode of acute illness as it can exacerbate nausea. Prednisone sent for lymphadenopathy.  Follow-up with PCP if this persists for 3 more weeks. Toradol administered today.  He has not taken any NSAIDs today. Tylenol and ibuprofen for symptomatic relief.  ED return precautions discussed.  This chart was dictated using voice recognition software, Dragon.  Despite the best efforts of this provider to  proofread and correct errors, errors may still occur which can change documentation meaning.  Final Clinical Impressions(s) / UC Diagnoses   Final diagnoses:  Lymphadenopathy of head and neck  Viral gastroenteritis  Tension headache     Discharge Instructions     -Take the Zofran (ondansetron) up to 3 times daily for nausea and vomiting. Dissolve one pill under your tongue or between your teeth and your cheek. -For your swollen lymph node, start the prednisone (Deltasone), 2 pills taken together in the morning for 5 days in a row.  This can give you energy, so take earlier in the day. -Because we gave you a shot of pain medication, avoid other NSAIDs today like ibuprofen and Aleve.  You can still take Tylenol, 1000 mg up to 3 times daily. -Starting tomorrow, you can take Take Tylenol 1000 mg 3 times daily, and ibuprofen 800 mg 3 times daily with food.  You can take these together, or alternate every 3-4 hours. -Seek additional medical attention immediately if your symptoms get worse, including the worst headache of your life, vision changes, weakness or sensation changes in 1 arm or 1 leg, shortness of breath, dizziness, severe back pain, etc.    ED Prescriptions    Medication Sig Dispense Auth. Provider   ondansetron (ZOFRAN ODT) 8 MG disintegrating tablet Take 1 tablet (8 mg total) by mouth every 8 (eight) hours as needed for nausea or vomiting. 20 tablet Edgar Stone, Edgar Guardiola E, PA-C   predniSONE (DELTASONE) 20 MG tablet Take 2 tablets (40 mg total) by mouth daily for 5 days. 10 tablet Edgar Stone, Hajira Verhagen E, PA-C     PDMP not reviewed this encounter.   Edgar Stone, Jye Fariss E, PA-C 07/01/20 1736

## 2020-07-01 NOTE — ED Triage Notes (Signed)
C/O "knot" to left posterior neck onset Monday.  C/O "stiffness and swelling" in posterior neck with painful head movements.  States pain progressing over past few days.  Now having some nausea, vomiting; able to keep down some PO fluids.  C/O "tingly pressure that goes around my whole head with burning going down into both my ears and throat".  Reports taking Tyl approx 2 hrs ago.

## 2020-07-01 NOTE — Discharge Instructions (Addendum)
-  Take the Zofran (ondansetron) up to 3 times daily for nausea and vomiting. Dissolve one pill under your tongue or between your teeth and your cheek. -For your swollen lymph node, start the prednisone (Deltasone), 2 pills taken together in the morning for 5 days in a row.  This can give you energy, so take earlier in the day. -Because we gave you a shot of pain medication, avoid other NSAIDs today like ibuprofen and Aleve.  You can still take Tylenol, 1000 mg up to 3 times daily. -Starting tomorrow, you can take Take Tylenol 1000 mg 3 times daily, and ibuprofen 800 mg 3 times daily with food.  You can take these together, or alternate every 3-4 hours. -Seek additional medical attention immediately if your symptoms get worse, including the worst headache of your life, vision changes, weakness or sensation changes in 1 arm or 1 leg, shortness of breath, dizziness, severe back pain, etc.

## 2020-07-03 ENCOUNTER — Other Ambulatory Visit: Payer: Self-pay

## 2020-07-03 ENCOUNTER — Ambulatory Visit
Admission: EM | Admit: 2020-07-03 | Discharge: 2020-07-03 | Disposition: A | Discharge status: Home / Self Care | Payer: PRIVATE HEALTH INSURANCE

## 2020-07-03 DIAGNOSIS — R591 Generalized enlarged lymph nodes: Secondary | ICD-10-CM | POA: Diagnosis not present

## 2020-07-03 NOTE — Discharge Instructions (Addendum)
-  Finish your prednisone as advised. -Follow-up with primary care if symptoms persist.

## 2020-07-03 NOTE — ED Triage Notes (Signed)
Pt also has c/o low back pain  And stiffness

## 2020-07-03 NOTE — ED Provider Notes (Signed)
[facility]    CSN: [id number] Arrival date & time: 07/03/20  0850      History   Chief Complaint Chief Complaint  Patient presents with  . Neck Pain    HPI Edgar Stone is a 25 y.o. male presenting with continued lymphadenopathy and headache.  Medical history includes substance abuse mood disorder, cannabis hyperemesis syndrome, alcohol abuse, moderate recurrent major depression. I last saw this patient 2 days ago for viral gastroenteritis, lymphadenopathy, tension headache.  I treated him at that time with Zofran ODT, prednisone, Toradol.  This patient is a poor historian. --Notes 7  days of left-sided neck pain and swollen lymph node, unchanged.  States pain seems to be radiating out from the lymph node and extending towards his left ear.  Has been taking the prednisone as directed. Denies ear pain, hearing changes, discharge from ear, dizziness, tinnitus. Denies diarrhea, shortness of breath, chest pain, cough, congestion, facial pain, teeth pain, sore throat, loss of taste/smell. Denies back pain -At his last visit also noted nausea with vomiting, this is completely improved on its own.  Denies nausea, vomiting, diarrhea, abdominal pain.  Last bowel movement was yesterday and was normal, denies changes in urinary frequency. -Notes intermittent lumbar paraspinous muscle tenderness, unchanged.  Denies trauma, overuse, pain radiating down legs, weakness or sensation changes in legs.Denies pain shooting down legs, denies numbness in arms/legs, denies weakness in arms/legs, denies saddle anesthesia, denies bowel/bladder incontinence.      HPI  Past Medical History:  Diagnosis Date  . Depression     Patient Active Problem List   Diagnosis Date Noted  . Substance induced mood disorder (HCC) 06/19/2020  . Cannabis hyperemesis syndrome concurrent with and due to cannabis abuse (HCC) 06/19/2020  . Moderate recurrent major depression (HCC) 10/19/2017  . Alcohol  abuse 10/19/2017    Past Surgical History:  Procedure Laterality Date  . KNEE SURGERY         Home Medications    Prior to Admission medications   Medication Sig Start Date End Date Taking? Authorizing Provider  FLUoxetine (PROZAC) 20 MG capsule Take 1 capsule (20 mg total) by mouth daily. 10/19/17 10/19/18  Clapacs, Jackquline Denmark, MD  HYDROcodone-acetaminophen (NORCO/VICODIN) 5-325 MG tablet Take 1 tablet by mouth every 6 (six) hours as needed for severe pain. Patient not taking: No sig reported 10/26/16   Antony Madura, PA-C  hydrOXYzine (ATARAX/VISTARIL) 10 MG tablet Take 10 mg by mouth daily.    [provider]  ibuprofen (ADVIL,MOTRIN) 600 MG tablet Take 1 tablet (600 mg total) by mouth every 6 (six) hours as needed. Patient not taking: No sig reported 10/26/16   Antony Madura, PA-C  ondansetron (ZOFRAN ODT) 8 MG disintegrating tablet Take 1 tablet (8 mg total) by mouth every 8 (eight) hours as needed for nausea or vomiting. 07/01/20   Rhys Martini, PA-C  predniSONE (DELTASONE) 20 MG tablet Take 2 tablets (40 mg total) by mouth daily for 5 days. 07/01/20 07/06/20  Rhys Martini, PA-C  QUEtiapine (SEROQUEL) 100 MG tablet Take 1 tablet (100 mg total) by mouth at bedtime. 06/19/20   Shanna Cisco, NP  sertraline (ZOLOFT) 25 MG tablet Take 25-50 mg by mouth daily. Per Pt: Take one tablet by mouth daily, May take 2 tablets if feeling anxious.    [provider]  traMADol (ULTRAM) 50 MG tablet Take 1 tablet (50 mg total) by mouth every 6 (six) hours as needed for moderate pain. Patient not taking:  No sig reported 09/10/16   Joni Reining, PA-C    Family History Family History  Problem Relation Age of Onset  . Diabetes Mother   . Thyroid disease Mother   . Healthy Father     Social History Social History   Tobacco Use  . Smoking status: Never Smoker  . Smokeless tobacco: Never Used  Vaping Use  . Vaping Use: Never used  Substance Use Topics  . Alcohol use: No  .  Drug use: Yes    Types: Marijuana     Allergies   Patient has no known allergies.   Review of Systems Review of Systems  Constitutional: Negative for appetite change, chills, diaphoresis, fatigue, fever and unexpected weight change.  HENT: Negative for congestion, ear pain, sinus pressure, sinus pain, sneezing, sore throat, trouble swallowing and voice change.   Eyes: Negative for photophobia, pain, discharge, redness, itching and visual disturbance.  Respiratory: Negative for cough, chest tightness and shortness of breath.   Cardiovascular: Negative for chest pain, palpitations and leg swelling.  Gastrointestinal: Negative for abdominal distention, abdominal pain, anal bleeding, blood in stool, constipation, diarrhea, nausea, rectal pain and vomiting.  Genitourinary: Negative for dysuria, flank pain, frequency and urgency.  Musculoskeletal: Positive for back pain. Negative for gait problem, myalgias, neck pain and neck stiffness.  Neurological: Negative for dizziness, tremors, seizures, syncope, facial asymmetry, speech difficulty, weakness, light-headedness, numbness and headaches.  Psychiatric/Behavioral: Negative for agitation, decreased concentration, dysphoric mood, hallucinations and suicidal ideas. The patient is not nervous/anxious.   All other systems reviewed and are negative.    Physical Exam Triage Vital Signs ED Triage Vitals  Enc Vitals Group     BP      Pulse      Resp      Temp      Temp src      SpO2      Weight      Height      Head Circumference      Peak Flow      Pain Score      Pain Loc      Pain Edu?      Excl. in GC?    No data found.  Updated Vital Signs BP 121/77   Pulse 74   Temp 98.4 F (36.9 C)   Resp 18   SpO2 97%   Visual Acuity Right Eye Distance:   Left Eye Distance:   Bilateral Distance:    Right Eye Near:   Left Eye Near:    Bilateral Near:     Physical Exam Vitals reviewed.  Constitutional:      General: Edgar Stone is not  in acute distress.    Appearance: Normal appearance. Edgar Stone is not ill-appearing.  HENT:     Head: Normocephalic and atraumatic.     Mouth/Throat:     Mouth: Mucous membranes are moist.     Comments: Moist mucous membranes Eyes:     Extraocular Movements: Extraocular movements intact.     Pupils: Pupils are equal, round, and reactive to light.  Cardiovascular:     Rate and Rhythm: Normal rate and regular rhythm.     Heart sounds: Normal heart sounds.  Pulmonary:     Effort: Pulmonary effort is normal.     Breath sounds: Normal breath sounds and air entry. No wheezing, rhonchi or rales.  Abdominal:     General: Bowel sounds are normal. There is no distension.     Palpations: Abdomen is soft.  There is no mass.     Tenderness: There is no abdominal tenderness. There is no right CVA tenderness, left CVA tenderness, guarding or rebound.     Comments: No bowel or bladder incontinence.  Musculoskeletal:     Cervical back: Normal range of motion and neck supple. No swelling, deformity, signs of trauma, rigidity, spasms, tenderness, bony tenderness or crepitus. No pain with movement.     Thoracic back: No swelling, deformity, signs of trauma, spasms, tenderness or bony tenderness. Normal range of motion. No scoliosis.     Lumbar back: No swelling, deformity, signs of trauma, spasms, tenderness or bony tenderness. Normal range of motion. Negative right straight leg raise test and negative left straight leg raise test. No scoliosis.     Comments: Strength 5/5 in UEs and LEs. Absolutely no paraspinous or spinous tendernes or deformity.  Lymphadenopathy:     Cervical: Cervical adenopathy present.     Left cervical: Deep cervical adenopathy present.     Comments: Lymphadenopathy is improved from 2 days ago.  Skin:    General: Skin is warm.     Capillary Refill: Capillary refill takes less than 2 seconds.     Comments: Good skin turgor  Neurological:     General: No focal deficit present.      Mental Status: Edgar Stone is alert and oriented to person, place, and time. Mental status is at baseline.     Cranial Nerves: Cranial nerves are intact. No cranial nerve deficit or facial asymmetry.     Sensory: Sensation is intact. No sensory deficit.     Motor: Motor function is intact. No weakness.     Coordination: Coordination is intact. Coordination normal.     Gait: Gait is intact. Gait normal.     Comments: CN 2-12 intact. No weakness or numbness in UEs or LEs.  Psychiatric:        Mood and Affect: Mood normal.        Behavior: Behavior normal.        Thought Content: Thought content normal.        Judgment: Judgment normal.   Negative Brudzinski, kernig   UC Treatments / Results  Labs (all labs ordered are listed, but only abnormal results are displayed) Labs Reviewed - No data to display  EKG   Radiology No results found.  Procedures Procedures (including critical care time)  Medications Ordered in UC Medications - No data to display  Initial Impression / Assessment and Plan / UC Course  I have reviewed the triage vital signs and the nursing notes.  Pertinent labs & imaging results that were available during my care of the patient were reviewed by me and considered in my medical decision making (see chart for details).     This patient is a 25 year old male presenting with lymphadenopathy that is improving on course of prednisone. Today this pt is afebrile nontachycardic nontachypneic, oxygenating well on room air, no wheezes rhonchi or rales. Benign neuro exam.  No red flag symptoms.  Continue current regimen.  Follow-up with primary care.  ED return precautions discussed.  I last saw this patient 2 days ago for viral gastroenteritis, lymphadenopathy, tension headache.  I treated him at that time with Zofran ODT, prednisone, Toradol at that time.  Final Clinical Impressions(s) / UC Diagnoses   Final diagnoses:  Lymphadenopathy     Discharge Instructions      -Finish your prednisone as advised. -Follow-up with primary care if symptoms persist.    ED  Prescriptions    None     PDMP not reviewed this encounter.   Rhys MartiniGraham, Tahesha Skeet E, PA-C 07/03/20 [phone]

## 2020-07-03 NOTE — ED Triage Notes (Signed)
Pt returns for increasing swelling in neck that began Monday , was seen on 4/3 for same

## 2020-07-24 ENCOUNTER — Ambulatory Visit (HOSPITAL_COMMUNITY): Payer: No Payment, Other | Admitting: Clinical

## 2020-08-07 ENCOUNTER — Ambulatory Visit (HOSPITAL_COMMUNITY): Payer: Self-pay | Admitting: Clinical

## 2020-08-22 ENCOUNTER — Ambulatory Visit (HOSPITAL_COMMUNITY): Payer: No Payment, Other | Admitting: Psychiatry

## 2020-09-02 NOTE — Progress Notes (Signed)
Patient did not show for appointment.   

## 2020-09-03 ENCOUNTER — Other Ambulatory Visit: Payer: Self-pay

## 2020-09-03 ENCOUNTER — Encounter: Payer: Self-pay | Admitting: Family

## 2020-09-03 DIAGNOSIS — Z7689 Persons encountering health services in other specified circumstances: Secondary | ICD-10-CM

## 2020-10-01 ENCOUNTER — Other Ambulatory Visit: Payer: Self-pay

## 2020-10-01 ENCOUNTER — Encounter (HOSPITAL_COMMUNITY): Payer: Self-pay | Admitting: Emergency Medicine

## 2020-10-01 ENCOUNTER — Emergency Department (HOSPITAL_COMMUNITY)
Admission: EM | Admit: 2020-10-01 | Discharge: 2020-10-01 | Disposition: A | Discharge status: Home / Self Care | Payer: Self-pay | Attending: Emergency Medicine | Admitting: Emergency Medicine

## 2020-10-01 ENCOUNTER — Emergency Department (HOSPITAL_COMMUNITY): Payer: Self-pay

## 2020-10-01 DIAGNOSIS — Y9389 Activity, other specified: Secondary | ICD-10-CM | POA: Insufficient documentation

## 2020-10-01 DIAGNOSIS — X500XXA Overexertion from strenuous movement or load, initial encounter: Secondary | ICD-10-CM | POA: Insufficient documentation

## 2020-10-01 DIAGNOSIS — Z79899 Other long term (current) drug therapy: Secondary | ICD-10-CM | POA: Insufficient documentation

## 2020-10-01 DIAGNOSIS — Y990 Civilian activity done for income or pay: Secondary | ICD-10-CM | POA: Insufficient documentation

## 2020-10-01 DIAGNOSIS — S93401A Sprain of unspecified ligament of right ankle, initial encounter: Secondary | ICD-10-CM | POA: Insufficient documentation

## 2020-10-01 NOTE — ED Provider Notes (Signed)
[facility]-EMERGENCY DEPT Provider Note   CSN: [id number] Arrival date & time: 10/01/20  3614     History No chief complaint on file.   Edgar Stone is a 25 y.o. male.  Patient is a 25 year old male presenting today with complaints of right ankle pain.  1 week ago he was power washing when he stepped in a hole and his ankle twisted outward and popped.  He thought it would just get better but over the last week he has had worsening pain in the right side of the ankle every time he tries to walk.  It is improved with rest and if he takes Tylenol and ibuprofen but then comes back when he starts walking again.  He has no pain anywhere else.  No numbness or tingling in his toes.  The history is provided by the patient.      Past Medical History:  Diagnosis Date   Depression     Patient Active Problem List   Diagnosis Date Noted   Substance induced mood disorder (HCC) 06/19/2020   Cannabis hyperemesis syndrome concurrent with and due to cannabis abuse (HCC) 06/19/2020   Moderate recurrent major depression (HCC) 10/19/2017   Alcohol abuse 10/19/2017    Past Surgical History:  Procedure Laterality Date   KNEE SURGERY         Family History  Problem Relation Age of Onset   Diabetes Mother    Thyroid disease Mother    Healthy Father     Social History   Tobacco Use   Smoking status: Never   Smokeless tobacco: Never  Vaping Use   Vaping Use: Never used  Substance Use Topics   Alcohol use: No   Drug use: Yes    Types: Marijuana    Home Medications Prior to Admission medications   Medication Sig Start Date End Date Taking? Authorizing Provider  FLUoxetine (PROZAC) 20 MG capsule Take 1 capsule (20 mg total) by mouth daily. 10/19/17 10/19/18  Clapacs, Jackquline Denmark, MD  HYDROcodone-acetaminophen (NORCO/VICODIN) 5-325 MG tablet Take 1 tablet by mouth every 6 (six) hours as needed for severe pain. Patient not taking: No sig reported 10/26/16    Antony Madura, PA-C  hydrOXYzine (ATARAX/VISTARIL) 10 MG tablet Take 10 mg by mouth daily.    [provider]  ibuprofen (ADVIL,MOTRIN) 600 MG tablet Take 1 tablet (600 mg total) by mouth every 6 (six) hours as needed. Patient not taking: No sig reported 10/26/16   Antony Madura, PA-C  ondansetron (ZOFRAN ODT) 8 MG disintegrating tablet Take 1 tablet (8 mg total) by mouth every 8 (eight) hours as needed for nausea or vomiting. 07/01/20   Rhys Martini, PA-C  QUEtiapine (SEROQUEL) 100 MG tablet Take 1 tablet (100 mg total) by mouth at bedtime. 06/19/20   Shanna Cisco, NP  sertraline (ZOLOFT) 25 MG tablet Take 25-50 mg by mouth daily. Per Pt: Take one tablet by mouth daily, May take 2 tablets if feeling anxious.    [provider]  traMADol (ULTRAM) 50 MG tablet Take 1 tablet (50 mg total) by mouth every 6 (six) hours as needed for moderate pain. Patient not taking: No sig reported 09/10/16   Joni Reining, PA-C    Allergies    Patient has no known allergies.  Review of Systems   Review of Systems  All other systems reviewed and are negative.  Physical Exam Updated Vital Signs BP 104/89 (BP Location: Right Arm)   Pulse 71  Temp 97.8 F (36.6 C) (Oral)   Resp 19   Ht 6' (1.829 m)   Wt 90.7 kg   SpO2 100%   BMI 27.12 kg/m   Physical Exam Vitals and nursing note reviewed.  Constitutional:      General: He is not in acute distress.    Appearance: Normal appearance. He is normal weight.  HENT:     Head: Normocephalic and atraumatic.  Eyes:     Extraocular Movements: Extraocular movements intact.     Pupils: Pupils are equal, round, and reactive to light.  Cardiovascular:     Rate and Rhythm: Normal rate.  Pulmonary:     Effort: Pulmonary effort is normal.  Musculoskeletal:        General: Tenderness present.     Right ankle: Tenderness present over the lateral malleolus. Normal range of motion.       Feet:  Skin:    General: Skin is warm and dry.   Neurological:     Mental Status: He is alert. Mental status is at baseline.  Psychiatric:        Mood and Affect: Mood normal.        Behavior: Behavior normal.    ED Results / Procedures / Treatments   Labs (all labs ordered are listed, but only abnormal results are displayed) Labs Reviewed - No data to display  EKG None  Radiology DG Ankle Complete Right  Result Date: 10/01/2020 CLINICAL DATA:  Pt. States he rolled his ankle at work 1 week ago. He complains of mostly lateral pain in his right ankle and the pain radiates to the back of his ankle. His right ankle hurts to walk and apply pressure. EXAM: RIGHT ANKLE - COMPLETE 3+ VIEW COMPARISON:  None. FINDINGS: There is no evidence of fracture, dislocation, or joint effusion. There is no evidence of arthropathy or other focal bone abnormality. Soft tissues are unremarkable. IMPRESSION: Negative. Electronically Signed   By: Amie Portland M.D.   On: 10/01/2020 11:16    Procedures Procedures   Medications Ordered in ED Medications - No data to display  ED Course  I have reviewed the triage vital signs and the nursing notes.  Pertinent labs & imaging results that were available during my care of the patient were reviewed by me and considered in my medical decision making (see chart for details).    MDM Rules/Calculators/A&P                          Patient here with ankle injury 1 week ago. No other complicating feature. Plain films neg.  Pt placed in cam walker and crutches.  MDM   Amount and/or Complexity of Data Reviewed Tests in the radiology section of CPT: ordered and reviewed Independent visualization of images, tracings, or specimens: yes     Final Clinical Impression(s) / ED Diagnoses Final diagnoses:  Sprain of right ankle, unspecified ligament, initial encounter    Rx / DC Orders ED Discharge Orders     None        Gwyneth Sprout, MD 10/01/20 1134

## 2020-10-01 NOTE — ED Triage Notes (Signed)
Patient states he rolled his ankle last week at work, states it is painful to walk and might be bruised, ambulatory in triage.

## 2020-11-01 ENCOUNTER — Emergency Department (HOSPITAL_COMMUNITY)
Admission: EM | Admit: 2020-11-01 | Discharge: 2020-11-01 | Disposition: A | Discharge status: Home / Self Care | Payer: Self-pay | Attending: Emergency Medicine | Admitting: Emergency Medicine

## 2020-11-01 ENCOUNTER — Emergency Department (HOSPITAL_COMMUNITY): Payer: Self-pay

## 2020-11-01 ENCOUNTER — Other Ambulatory Visit: Payer: Self-pay

## 2020-11-01 ENCOUNTER — Encounter (HOSPITAL_COMMUNITY): Payer: Self-pay | Admitting: *Deleted

## 2020-11-01 DIAGNOSIS — S92514A Nondisplaced fracture of proximal phalanx of right lesser toe(s), initial encounter for closed fracture: Secondary | ICD-10-CM | POA: Insufficient documentation

## 2020-11-01 DIAGNOSIS — S61411A Laceration without foreign body of right hand, initial encounter: Secondary | ICD-10-CM | POA: Insufficient documentation

## 2020-11-01 DIAGNOSIS — W228XXA Striking against or struck by other objects, initial encounter: Secondary | ICD-10-CM | POA: Insufficient documentation

## 2020-11-01 DIAGNOSIS — S99921A Unspecified injury of right foot, initial encounter: Secondary | ICD-10-CM

## 2020-11-01 DIAGNOSIS — Z23 Encounter for immunization: Secondary | ICD-10-CM | POA: Insufficient documentation

## 2020-11-01 DIAGNOSIS — W260XXA Contact with knife, initial encounter: Secondary | ICD-10-CM | POA: Insufficient documentation

## 2020-11-01 DIAGNOSIS — S99201A Unspecified physeal fracture of phalanx of right toe, initial encounter for closed fracture: Secondary | ICD-10-CM

## 2020-11-01 DIAGNOSIS — M79674 Pain in right toe(s): Secondary | ICD-10-CM | POA: Insufficient documentation

## 2020-11-01 MED ORDER — TETANUS-DIPHTH-ACELL PERTUSSIS 5-2.5-18.5 LF-MCG/0.5 IM SUSY
0.5000 mL | PREFILLED_SYRINGE | Freq: Once | INTRAMUSCULAR | Status: AC
Start: 2020-11-01 — End: 2020-11-01
  Administered 2020-11-01: 0.5 mL via INTRAMUSCULAR
  Filled 2020-11-01: qty 0.5

## 2020-11-01 MED ORDER — LIDOCAINE HCL (PF) 1 % IJ SOLN
5.0000 mL | Freq: Once | INTRAMUSCULAR | Status: AC
  Administered 2020-11-01: 5 mL
  Filled 2020-11-01: qty 30

## 2020-11-01 NOTE — ED Provider Notes (Signed)
[facility]-EMERGENCY DEPT Provider Note   CSN: [id number] Arrival date & time: 11/01/20  [id number]     History Chief Complaint  Patient presents with   Laceration   Toe Injury    Edgar Stone is a 25 y.o. male with no pertinent past medical history that presents emerged department today for thumb laceration at the base of his thumb.  Patient states that he accidentally placed his hand in his pocket and his knife accidentally opened, has a very small laceration to volar aspect of palm.  Area with bleeding controlled.  Patient states that he was upset and he kicked the refrigerator, injuring his middle right toe.  States that that area is numb.  Denies any ankle pain or pain elsewhere.  Did not injure himself elsewhere.  Is not sure when his last tetanus is..  no Other complaints at this time.  HPI     Past Medical History:  Diagnosis Date   Depression     Patient Active Problem List   Diagnosis Date Noted   Substance induced mood disorder (HCC) 06/19/2020   Cannabis hyperemesis syndrome concurrent with and due to cannabis abuse (HCC) 06/19/2020   Moderate recurrent major depression (HCC) 10/19/2017   Alcohol abuse 10/19/2017    Past Surgical History:  Procedure Laterality Date   KNEE SURGERY         Family History  Problem Relation Age of Onset   Diabetes Mother    Thyroid disease Mother    Healthy Father     Social History   Tobacco Use   Smoking status: Never   Smokeless tobacco: Never  Vaping Use   Vaping Use: Never used  Substance Use Topics   Alcohol use: No   Drug use: Yes    Types: Marijuana    Home Medications Prior to Admission medications   Medication Sig Start Date End Date Taking? Authorizing Provider  FLUoxetine (PROZAC) 20 MG capsule Take 1 capsule (20 mg total) by mouth daily. 10/19/17 10/19/18  Clapacs, Jackquline DenmarkJohn T, MD  HYDROcodone-acetaminophen (NORCO/VICODIN) 5-325 MG tablet Take 1 tablet by mouth every 6 (six) hours  as needed for severe pain. Patient not taking: No sig reported 10/26/16   Antony MaduraHumes, Kelly, PA-C  hydrOXYzine (ATARAX/VISTARIL) 10 MG tablet Take 10 mg by mouth daily.    [provider]  ibuprofen (ADVIL,MOTRIN) 600 MG tablet Take 1 tablet (600 mg total) by mouth every 6 (six) hours as needed. Patient not taking: No sig reported 10/26/16   Antony MaduraHumes, Kelly, PA-C  ondansetron (ZOFRAN ODT) 8 MG disintegrating tablet Take 1 tablet (8 mg total) by mouth every 8 (eight) hours as needed for nausea or vomiting. 07/01/20   Rhys MartiniGraham, Laura E, PA-C  QUEtiapine (SEROQUEL) 100 MG tablet Take 1 tablet (100 mg total) by mouth at bedtime. 06/19/20   Shanna CiscoParsons, Brittney E, NP  sertraline (ZOLOFT) 25 MG tablet Take 25-50 mg by mouth daily. Per Pt: Take one tablet by mouth daily, May take 2 tablets if feeling anxious.    [provider]  traMADol (ULTRAM) 50 MG tablet Take 1 tablet (50 mg total) by mouth every 6 (six) hours as needed for moderate pain. Patient not taking: No sig reported 09/10/16   Joni ReiningSmith, Ronald K, PA-C    Allergies    Patient has no known allergies.  Review of Systems   Review of Systems  Constitutional:  Negative for chills, diaphoresis, fatigue and fever.  HENT:  Negative for congestion, sore throat and trouble  swallowing.   Eyes:  Negative for pain and visual disturbance.  Respiratory:  Negative for cough, shortness of breath and wheezing.   Cardiovascular:  Negative for chest pain, palpitations and leg swelling.  Gastrointestinal:  Negative for abdominal distention, abdominal pain, diarrhea, nausea and vomiting.  Genitourinary:  Negative for difficulty urinating.  Musculoskeletal:  Positive for arthralgias. Negative for back pain, neck pain and neck stiffness.  Skin:  Positive for wound. Negative for color change and pallor.  Neurological:  Negative for dizziness, speech difficulty, weakness and headaches.  Psychiatric/Behavioral:  Negative for confusion.    Physical Exam Updated  Vital Signs BP 120/78 (BP Location: Left Arm)   Pulse 77   Temp 98.2 F (36.8 C)   Resp 16   Ht 6' (1.829 m)   Wt 90.7 kg   SpO2 99%   BMI 27.12 kg/m   Physical Exam Constitutional:      General: He is not in acute distress.    Appearance: Normal appearance. He is not ill-appearing, toxic-appearing or diaphoretic.  Cardiovascular:     Rate and Rhythm: Normal rate and regular rhythm.     Pulses: Normal pulses.  Pulmonary:     Effort: Pulmonary effort is normal.     Breath sounds: Normal breath sounds.  Musculoskeletal:        General: Normal range of motion.       Hands:       Feet:     Comments: 1 cm laceration to area depicted in graphic.  Area is extremely superficial.  Feet:     Comments: Midfoot tenderness and tenderness to middle toe on right side.  Area slightly ecchymotic, normal range of motion to all joints in mid toe.  No tenderness to palpation in other toes.  Good cap refill.  DP pulses 2+.  Full range of motion to ankle with no tenderness.  Normal gait. Skin:    General: Skin is warm and dry.     Capillary Refill: Capillary refill takes less than 2 seconds.  Neurological:     General: No focal deficit present.     Mental Status: He is alert and oriented to person, place, and time.  Psychiatric:        Mood and Affect: Mood normal.        Behavior: Behavior normal.        Thought Content: Thought content normal.    ED Results / Procedures / Treatments   Labs (all labs ordered are listed, but only abnormal results are displayed) Labs Reviewed - No data to display  EKG None  Radiology DG Foot Complete Right  Result Date: 11/01/2020 CLINICAL DATA:  The patient suffered a right third toe injury when he kicked a refrigerator. Initial encounter. EXAM: RIGHT FOOT COMPLETE - 3+ VIEW COMPARISON:  None. FINDINGS: The patient has an acute, nondisplaced fracture through the proximal metaphysis of the proximal phalanx of the third toe. No other acute abnormality is  identified. IMPRESSION: Acute nondisplaced fracture proximal metaphysis proximal phalanx of the third toe. Electronically Signed   By: Drusilla Kanner M.D.   On: 11/01/2020 11:03    Procedures .Marland KitchenLaceration Repair  Date/Time: 11/01/2020 10:49 AM Performed by: Farrel Gordon, PA-C Authorized by: Farrel Gordon, PA-C   Consent:    Consent obtained:  Verbal   Consent given by:  Patient   Risks discussed:  Infection, need for additional repair, pain, poor cosmetic result and poor wound healing   Alternatives discussed:  No treatment and delayed  treatment Universal protocol:    Procedure explained and questions answered to patient or proxy's satisfaction: yes     Relevant documents present and verified: yes     Test results available: yes     Imaging studies available: yes     Required blood products, implants, devices, and special equipment available: yes     Site/side marked: yes     Immediately prior to procedure, a time out was called: yes     Patient identity confirmed:  Verbally with patient Anesthesia:    Anesthesia method:  Local infiltration   Local anesthetic:  Lidocaine 1% w/o epi Laceration details:    Location:  Hand   Hand location:  R palm   Length (cm):  1   Depth (mm):  1 Exploration:    Hemostasis achieved with:  Cautery   Wound exploration: wound explored through full range of motion     Contaminated: no   Treatment:    Area cleansed with:  Povidone-iodine   Amount of cleaning:  Standard   Irrigation solution:  Tap water   Irrigation method:  Tap and syringe   Debridement:  None Skin repair:    Repair method:  Sutures   Suture size:  4-0   Suture material:  Prolene   Suture technique:  Simple interrupted   Number of sutures:  2 Repair type:    Repair type:  Simple Post-procedure details:    Dressing:  Open (no dressing)   Procedure completion:  Tolerated well, no immediate complications   Medications Ordered in ED Medications  lidocaine (PF)  (XYLOCAINE) 1 % injection 5 mL (5 mLs Infiltration Given 11/01/20 1140)  Tdap (BOOSTRIX) injection 0.5 mL (0.5 mLs Intramuscular Given 11/01/20 1140)    ED Course  I have reviewed the triage vital signs and the nursing notes.  Pertinent labs & imaging results that were available during my care of the patient were reviewed by me and considered in my medical decision making (see chart for details).    MDM Rules/Calculators/A&P                          Patient presents to the emerge department today for laceration to the volar aspect of his palm.  It is extremely superficial and about 1 cm.  Discussed options for Dermabond, however patient does want sutures at this time.  See procedure note, did place 2 sutures.  Do not think that antibiotics are appropriate this time due to how superficial this is.  Patient states that this was a clean knife.  Tetanus updated today.  In regards to foot, appears as if patient has a fracture in his third toe, will buddy tape and placed in postop shoe.  Did discuss this with Dr. Fredderick Phenix since it does cross metaphysis.  Patient will follow up with orthopedics, strict return precautions given.  Patient expressed understanding and is distally neurovascularly intact and is able to ambulate.  Patient to be discharged at this time.  Doubt need for further emergent work up at this time. I explained the diagnosis and have given explicit precautions to return to the ER including for any other new or worsening symptoms. The patient understands and accepts the medical plan as it's been dictated and I have answered their questions. Discharge instructions concerning home care and prescriptions have been given. The patient is STABLE and is discharged to home in good condition.   Final Clinical Impression(s) / ED Diagnoses Final  diagnoses:  Toe injury, right, initial encounter  Closed physeal fracture of proximal phalanx of lesser toe of right foot, unspecified physeal fracture  configuration, initial encounter  Laceration of right hand, foreign body presence unspecified, initial encounter    Rx / DC Orders ED Discharge Orders     None        Farrel Gordon, PA-C 11/01/20 1144    Rolan Bucco, MD 11/01/20 1353

## 2020-11-01 NOTE — ED Triage Notes (Signed)
Pt was putting hand in pocket, did not know knife was open, cut rt base of thumb, bleeding controlled. He was upset and kicked the refrigerator injuring middle rt toe.

## 2020-11-01 NOTE — Discharge Instructions (Addendum)
  You were evaluated in the Emergency Department and after careful evaluation, we did not find any emergent condition requiring admission or further testing in the hospital.   Your exam/testing today was overall reassuring.  You do have a fracture in your third toe as we discussed, please follow-up with orthopedic doctor.  Continue to wear your postop shoe.  Elevate your leg, you can also ice the toe.  In regards to your sutures, please come back in 7 days to have these removed.  If you develop fever, streaking, worsening pain or any new worsening concerning symptoms please come back to the ER.  You can take Tylenol directed on the bottle for pain.  Use the attached instructions. Please return to the Emergency Department if you experience any worsening of your condition.  Thank you for allowing [place] to be a part of your care. Please speak to your pharmacist about any new medications prescribed today in regards to side effects or interactions with other medications.

## 2021-01-21 ENCOUNTER — Emergency Department (HOSPITAL_COMMUNITY): Payer: Self-pay

## 2021-01-21 ENCOUNTER — Encounter (HOSPITAL_COMMUNITY): Payer: Self-pay

## 2021-01-21 ENCOUNTER — Other Ambulatory Visit: Payer: Self-pay

## 2021-01-21 ENCOUNTER — Emergency Department (HOSPITAL_COMMUNITY)
Admission: EM | Admit: 2021-01-21 | Discharge: 2021-01-21 | Disposition: A | Discharge status: Home / Self Care | Payer: Self-pay | Attending: Emergency Medicine | Admitting: Emergency Medicine

## 2021-01-21 DIAGNOSIS — R059 Cough, unspecified: Secondary | ICD-10-CM | POA: Insufficient documentation

## 2021-01-21 DIAGNOSIS — R509 Fever, unspecified: Secondary | ICD-10-CM | POA: Insufficient documentation

## 2021-01-21 DIAGNOSIS — J069 Acute upper respiratory infection, unspecified: Secondary | ICD-10-CM

## 2021-01-21 DIAGNOSIS — R062 Wheezing: Secondary | ICD-10-CM | POA: Insufficient documentation

## 2021-01-21 DIAGNOSIS — Z20822 Contact with and (suspected) exposure to covid-19: Secondary | ICD-10-CM | POA: Insufficient documentation

## 2021-01-21 DIAGNOSIS — R61 Generalized hyperhidrosis: Secondary | ICD-10-CM | POA: Insufficient documentation

## 2021-01-21 LAB — RESP PANEL BY RT-PCR (FLU A&B, COVID) ARPGX2
Influenza A by PCR: NEGATIVE
Influenza B by PCR: NEGATIVE
SARS Coronavirus 2 by RT PCR: NEGATIVE

## 2021-01-21 MED ORDER — DM-GUAIFENESIN ER 30-600 MG PO TB12: 1.0000 | ORAL_TABLET | Freq: Two times a day (BID) | ORAL | 0 refills | Status: AC

## 2021-01-21 MED ORDER — ALBUTEROL SULFATE HFA 108 (90 BASE) MCG/ACT IN AERS
2.0000 | INHALATION_SPRAY | Freq: Once | RESPIRATORY_TRACT | Status: AC
  Administered 2021-01-21: 2 via RESPIRATORY_TRACT
  Filled 2021-01-21: qty 6.7

## 2021-01-21 NOTE — ED Triage Notes (Signed)
Pt reports cough, sinus congestion, body aches, chills, and low grade fever for 9 days. Denies SHOB and chest pain.

## 2021-01-21 NOTE — Discharge Instructions (Signed)
You have been seen and discharged from the emergency department.  Your flu and COVID swab are negative, your chest x-ray showed no pneumonia.  Use albuterol inhaler as prescribed.  Take over-the-counter medications for cough/symptom control.  Follow-up with your primary provider for reevaluation and further care. Take home medications as prescribed. If you have any worsening symptoms or further concerns for your health please return to an emergency department for further evaluation.

## 2021-01-21 NOTE — ED Provider Notes (Signed)
[facility]-EMERGENCY DEPT Provider Note   CSN: [id number] Arrival date & time: 01/21/21  0941     History Chief Complaint  Patient presents with   Cough   Nasal Congestion    Edgar Stone is a 25 y.o. male.  HPI  25 year old male with past medical history of substance abuse presents the emergency department with concern for sinus congestion, productive cough, chills.  The symptoms have been going on for the past week.  He states that his cough is productive of yellow phlegm.  He has had a fever at home but cannot give me a T-max.  States he has not been taking any medication over-the-counter to treat his symptoms.  No known recent COVID/flu contact.  Denies any GI symptoms.  Past Medical History:  Diagnosis Date   Depression     Patient Active Problem List   Diagnosis Date Noted   Substance induced mood disorder (HCC) 06/19/2020   Cannabis hyperemesis syndrome concurrent with and due to cannabis abuse (HCC) 06/19/2020   Moderate recurrent major depression (HCC) 10/19/2017   Alcohol abuse 10/19/2017    Past Surgical History:  Procedure Laterality Date   KNEE SURGERY         Family History  Problem Relation Age of Onset   Diabetes Mother    Thyroid disease Mother    Healthy Father     Social History   Tobacco Use   Smoking status: Never   Smokeless tobacco: Never  Vaping Use   Vaping Use: Never used  Substance Use Topics   Alcohol use: No   Drug use: Yes    Types: Marijuana    Home Medications Prior to Admission medications   Medication Sig Start Date End Date Taking? Authorizing Provider  FLUoxetine (PROZAC) 20 MG capsule Take 1 capsule (20 mg total) by mouth daily. 10/19/17 10/19/18  Clapacs, Jackquline Denmark, MD  HYDROcodone-acetaminophen (NORCO/VICODIN) 5-325 MG tablet Take 1 tablet by mouth every 6 (six) hours as needed for severe pain. Patient not taking: No sig reported 10/26/16   Antony Madura, PA-C  hydrOXYzine  (ATARAX/VISTARIL) 10 MG tablet Take 10 mg by mouth daily.    [provider]  ibuprofen (ADVIL,MOTRIN) 600 MG tablet Take 1 tablet (600 mg total) by mouth every 6 (six) hours as needed. Patient not taking: No sig reported 10/26/16   Antony Madura, PA-C  ondansetron (ZOFRAN ODT) 8 MG disintegrating tablet Take 1 tablet (8 mg total) by mouth every 8 (eight) hours as needed for nausea or vomiting. 07/01/20   Rhys Martini, PA-C  QUEtiapine (SEROQUEL) 100 MG tablet Take 1 tablet (100 mg total) by mouth at bedtime. 06/19/20   Shanna Cisco, NP  sertraline (ZOLOFT) 25 MG tablet Take 25-50 mg by mouth daily. Per Pt: Take one tablet by mouth daily, May take 2 tablets if feeling anxious.    [provider]  traMADol (ULTRAM) 50 MG tablet Take 1 tablet (50 mg total) by mouth every 6 (six) hours as needed for moderate pain. Patient not taking: No sig reported 09/10/16   Joni Reining, PA-C    Allergies    Patient has no known allergies.  Review of Systems   Review of Systems  Constitutional:  Positive for fatigue. Negative for chills and fever.  HENT:  Positive for congestion, sinus pressure and sinus pain.   Eyes:  Negative for visual disturbance.  Respiratory:  Positive for cough. Negative for shortness of breath.   Cardiovascular:  Negative  for chest pain and leg swelling.  Gastrointestinal:  Negative for abdominal pain, diarrhea and vomiting.  Genitourinary:  Negative for dysuria.  Skin:  Negative for rash.  Neurological:  Negative for headaches.   Physical Exam Updated Vital Signs BP 130/73 (BP Location: Right Arm)   Pulse 80   Temp 98.4 F (36.9 C) (Oral)   Resp 18   SpO2 95%   Physical Exam Vitals and nursing note reviewed.  Constitutional:      General: He is not in acute distress.    Appearance: Normal appearance. He is diaphoretic.  HENT:     Head: Normocephalic.     Mouth/Throat:     Mouth: Mucous membranes are moist.  Cardiovascular:     Rate and  Rhythm: Normal rate.  Pulmonary:     Effort: Pulmonary effort is normal. No respiratory distress.     Comments: Scattered wheezes predominantly in the right upper lung fields Abdominal:     Palpations: Abdomen is soft.     Tenderness: There is no abdominal tenderness.  Musculoskeletal:        General: No swelling.  Skin:    General: Skin is warm.  Neurological:     Mental Status: He is alert and oriented to person, place, and time. Mental status is at baseline.  Psychiatric:        Mood and Affect: Mood normal.    ED Results / Procedures / Treatments   Labs (all labs ordered are listed, but only abnormal results are displayed) Labs Reviewed  RESP PANEL BY RT-PCR (FLU A&B, COVID) [id number]    EKG None  Radiology No results found.  Procedures Procedures   Medications Ordered in ED Medications - No data to display  ED Course  I have reviewed the triage vital signs and the nursing notes.  Pertinent labs & imaging results that were available during my care of the patient were reviewed by me and considered in my medical decision making (see chart for details).    MDM Rules/Calculators/A&P                           25 year old male presents emergency department with weeklong upper respiratory infection symptoms.  Vitals are stable here.  Flu and COVID swab is negative.  Patient had scattered wheezing predominantly on the right side, chest x-ray shows no pneumonia.  Patient treated symptomatically and feels improved.  Patient most likely suffering from viral upper respiratory infection, will treat symptomatically for outpatient follow-up.  Patient at this time appears safe and stable for discharge and will be treated as an outpatient.  Discharge plan and strict return to ED precautions discussed, patient verbalizes understanding and agreement.  Final Clinical Impression(s) / ED Diagnoses Final diagnoses:  None    Rx / DC Orders ED Discharge Orders     None         Rozelle Logan, DO 01/21/21 1122

## 2023-06-03 ENCOUNTER — Emergency Department: Payer: Self-pay

## 2023-06-03 ENCOUNTER — Emergency Department
Admission: EM | Admit: 2023-06-03 | Discharge: 2023-06-03 | Disposition: A | Discharge status: Home / Self Care | Payer: Self-pay | Attending: Emergency Medicine | Admitting: Emergency Medicine

## 2023-06-03 ENCOUNTER — Other Ambulatory Visit: Payer: Self-pay

## 2023-06-03 DIAGNOSIS — R1031 Right lower quadrant pain: Secondary | ICD-10-CM | POA: Insufficient documentation

## 2023-06-03 DIAGNOSIS — R7989 Other specified abnormal findings of blood chemistry: Secondary | ICD-10-CM | POA: Insufficient documentation

## 2023-06-03 DIAGNOSIS — R1011 Right upper quadrant pain: Secondary | ICD-10-CM | POA: Insufficient documentation

## 2023-06-03 DIAGNOSIS — R1084 Generalized abdominal pain: Secondary | ICD-10-CM | POA: Insufficient documentation

## 2023-06-03 LAB — COMPREHENSIVE METABOLIC PANEL
ALT: 120 U/L — ABNORMAL HIGH (ref 0–44)
AST: 48 U/L — ABNORMAL HIGH (ref 15–41)
Albumin: 4.9 g/dL (ref 3.5–5.0)
Alkaline Phosphatase: 70 U/L (ref 38–126)
Anion gap: 7 (ref 5–15)
BUN: 17 mg/dL (ref 6–20)
CO2: 28 mmol/L (ref 22–32)
Calcium: 9.5 mg/dL (ref 8.9–10.3)
Chloride: 105 mmol/L (ref 98–111)
Creatinine, Ser: 1.06 mg/dL (ref 0.61–1.24)
GFR, Estimated: >60 mL/min (ref >60)
Glucose, Bld: 102 mg/dL — ABNORMAL HIGH (ref 70–99)
Potassium: 4.6 mmol/L (ref 3.5–5.1)
Sodium: 140 mmol/L (ref 135–145)
Total Bilirubin: 1.9 mg/dL — ABNORMAL HIGH (ref 0.0–1.2)
Total Protein: 8.4 g/dL — ABNORMAL HIGH (ref 6.5–8.1)

## 2023-06-03 LAB — CBC WITH DIFFERENTIAL/PLATELET
Abs Immature Granulocytes: 0.03 10*3/uL (ref 0.00–0.07)
Basophils Absolute: 0 10*3/uL (ref 0.0–0.1)
Basophils Relative: 0 %
Eosinophils Absolute: 0.1 10*3/uL (ref 0.0–0.5)
Eosinophils Relative: 1 %
HCT: 52.8 % — ABNORMAL HIGH (ref 39.0–52.0)
Hemoglobin: 18.6 g/dL — ABNORMAL HIGH (ref 13.0–17.0)
Immature Granulocytes: 0 %
Lymphocytes Relative: 32 %
Lymphs Abs: 3 10*3/uL (ref 0.7–4.0)
MCH: 31.8 pg (ref 26.0–34.0)
MCHC: 35.2 g/dL (ref 30.0–36.0)
MCV: 90.3 fL (ref 80.0–100.0)
Monocytes Absolute: 0.7 10*3/uL (ref 0.1–1.0)
Monocytes Relative: 8 %
Neutro Abs: 5.6 10*3/uL (ref 1.7–7.7)
Neutrophils Relative %: 59 %
Platelets: 218 10*3/uL (ref 150–400)
RBC: 5.85 MIL/uL — ABNORMAL HIGH (ref 4.22–5.81)
RDW: 12.1 % (ref 11.5–15.5)
WBC: 9.5 10*3/uL (ref 4.0–10.5)
nRBC: 0 % (ref 0.0–0.2)

## 2023-06-03 LAB — URINALYSIS, ROUTINE W REFLEX MICROSCOPIC
Bilirubin Urine: NEGATIVE
Glucose, UA: NEGATIVE mg/dL
Hgb urine dipstick: NEGATIVE
Ketones, ur: NEGATIVE mg/dL
Leukocytes,Ua: NEGATIVE
Nitrite: NEGATIVE
Protein, ur: NEGATIVE mg/dL
Specific Gravity, Urine: 1.028 (ref 1.005–1.030)
pH: 7 (ref 5.0–8.0)

## 2023-06-03 LAB — LIPASE, BLOOD: Lipase: 29 U/L (ref 11–51)

## 2023-06-03 MED ORDER — KETOROLAC TROMETHAMINE 15 MG/ML IJ SOLN
15.0000 mg | Freq: Once | INTRAMUSCULAR | Status: AC
  Administered 2023-06-03: 15 mg via INTRAVENOUS
  Filled 2023-06-03: qty 1

## 2023-06-03 MED ORDER — IOHEXOL 300 MG/ML  SOLN
100.0000 mL | Freq: Once | INTRAMUSCULAR | Status: AC | PRN
  Administered 2023-06-03: 100 mL via INTRAVENOUS

## 2023-06-03 MED ORDER — ONDANSETRON HCL 4 MG PO TABS: 4.0000 mg | ORAL_TABLET | Freq: Three times a day (TID) | ORAL | 0 refills | Status: AC | PRN

## 2023-06-03 NOTE — ED Notes (Signed)
 The pt advised his pain is still at a 7 but advised he does not want anything else for pain. Dr. Anner Crete was advised.

## 2023-06-03 NOTE — ED Triage Notes (Signed)
 First nurse note: pt to ED From [facility] for abd pain and rectal bleeding x3 weeks. Decreased eating. +diarrhea. +emesis

## 2023-06-03 NOTE — ED Provider Notes (Signed)
 [facility] Provider [facility]    Event Date/Time   First MD Initiated Contact with Patient 06/03/23 1131     (approximate)   History   Abdominal Pain   HPI Edgar Stone is a 28 y.o. male  Presenting today for abdominal pain.  Patient states onset of abdominal pain symptoms 3 weeks ago mostly in the upper abdomen.  It has recurred almost daily since then with pain after eating.  Worsening pain along the right side of his abdomen both in the upper and lower regions.  Intermittently has nausea but no vomiting and no nausea present today.  Initially had 3 episodes of bright red blood per rectum 3 weeks ago but has none since.  Now feels constipated.  Otherwise denies fever, cough, congestion, chest pain, shortness of breath, dysuria, hematuria.  No prior abdominal surgeries.  He states that he does use marijuana daily but otherwise denies other alcohol or drug use.     Physical Exam   Triage Vital Signs: ED Triage Vitals  Encounter Vitals Group     BP 06/03/23 1129 (!) 145/95     Systolic BP Percentile --      Diastolic BP Percentile --      Pulse Rate 06/03/23 1129 77     Resp 06/03/23 1129 18     Temp 06/03/23 1129 98.1 F (36.7 C)     Temp Source 06/03/23 1129 Oral     SpO2 06/03/23 1129 100 %     Weight 06/03/23 1127 209 lb 7 oz (95 kg)     Height 06/03/23 1127 6' (1.829 m)     Head Circumference --      Peak Flow --      Pain Score 06/03/23 1127 6     Pain Loc --      Pain Education --      Exclude from Growth Chart --     Most recent vital signs: Vitals:   06/03/23 1129  BP: (!) 145/95  Pulse: 77  Resp: 18  Temp: 98.1 F (36.7 C)  SpO2: 100%   Physical Exam: I have reviewed the vital signs and nursing notes. General: Awake, alert, no acute distress.  Nontoxic appearing. Head:  Atraumatic, normocephalic.   ENT:  EOM intact, PERRL. Oral mucosa is pink and moist with no lesions. Neck: Neck is supple with full range of motion, No  meningeal signs. Cardiovascular:  RRR, No murmurs. Peripheral pulses palpable and equal bilaterally. Respiratory:  Symmetrical chest wall expansion.  No rhonchi, rales, or wheezes.  Good air movement throughout.  No use of accessory muscles.   Musculoskeletal:  No cyanosis or edema. Moving extremities with full ROM Abdomen:  Soft, tenderness to palpation in the right upper quadrant and right lower quadrants, nondistended. Neuro:  GCS 15, moving all four extremities, interacting appropriately. Speech clear. Psych:  Calm, appropriate.   Skin:  Warm, dry, no rash.    ED Results / Procedures / Treatments   Labs (all labs ordered are listed, but only abnormal results are displayed) Labs Reviewed  CBC WITH DIFFERENTIAL/PLATELET - Abnormal; Notable for the following components:      Result Value   RBC 5.85 (*)    Hemoglobin 18.6 (*)    HCT 52.8 (*)    All other components within normal limits  COMPREHENSIVE METABOLIC PANEL - Abnormal; Notable for the following components:   Glucose, Bld 102 (*)    Total Protein 8.4 (*)    AST  48 (*)    ALT 120 (*)    Total Bilirubin 1.9 (*)    All other components within normal limits  URINALYSIS, ROUTINE W REFLEX MICROSCOPIC - Abnormal; Notable for the following components:   Color, Urine YELLOW (*)    APPearance CLEAR (*)    All other components within normal limits  LIPASE, BLOOD     EKG    RADIOLOGY Independently interpreted CT with no acute pathology   PROCEDURES:  Critical Care performed: No  Procedures   MEDICATIONS ORDERED IN ED: Medications  ketorolac (TORADOL) 15 MG/ML injection 15 mg (15 mg Intravenous Given 06/03/23 1151)  iohexol (OMNIPAQUE) 300 MG/ML solution 100 mL (100 mLs Intravenous Contrast Given 06/03/23 1230)     IMPRESSION / MDM / ASSESSMENT AND PLAN / ED COURSE  I reviewed the triage vital signs and the nursing notes.                              Differential diagnosis includes, but is not limited to,  cholecystitis, cholelithiasis, appendicitis, colitis, constipation  Patient's presentation is most consistent with acute complicated illness / injury requiring diagnostic workup.  Patient is a 28 year old male presenting today for postprandial abdominal pain with tenderness palpation in the right upper quadrant and right lower quadrant.  Vital signs otherwise stable.  No other associated symptoms at this time.  Will get CT imaging given chronicity of symptoms especially with that worsening to rule out any acute biliary or appendix pathology.  Laboratory workup most notable for slight elevation in T. bili and ALT.  Otherwise lipase and CBC unremarkable.  UA negative.  CT imaging shows no acute intra-abdominal pathology.  Patient was reassessed with minimal pain symptoms.  Although he has a elevation in these lab markers, with reassuring CT, I think he can be followed up with GI outpatient for ongoing monitoring and treatment.  Will send him home with Zofran.  Patient was agreeable with this plan and given strict return precautions.  Vital signs otherwise stable.  Clinical Course as of 06/03/23 1521  Wed Jun 03, 2023  1217 Comprehensive metabolic panel(!) Elevation in ALT and T. bili [DW]    Clinical Course User Index [DW] Janith Lima, MD     FINAL CLINICAL IMPRESSION(S) / ED DIAGNOSES   Final diagnoses:  Elevated LFTs  Generalized abdominal pain     Rx / DC Orders   ED Discharge Orders          Ordered    Ambulatory referral to Gastroenterology       Comments: Postprandial pain, elevated LFTs, outpatient monitoring   06/03/23 1520    ondansetron (ZOFRAN) 4 MG tablet  Every 8 hours PRN        06/03/23 1520             Note:  This document was prepared using Dragon voice recognition software and may include unintentional dictation errors.   Janith Lima, MD 06/03/23 [phone]

## 2023-06-03 NOTE — Discharge Instructions (Signed)
 Your CT imaging showed no acute infection in your abdomen.  Most notably on your laboratory workup as you had slight elevation in some of your liver enzymes.  Nothing acutely severe at this time but I will have you follow-up with the GI team for ongoing outpatient management.  I have attached the doctor's name to your discharge paperwork and they should call you but if you have not received a call within a week you can call their office to set this appointment up.  I have also sent nausea medication to your pharmacy.

## 2023-06-03 NOTE — ED Notes (Addendum)
 27 yom lying supine in the bed with his head slightly elevated. The pt is warm, pink, and dry. The pt is alert and oriented x 4. The pt advised he has had abdominal pain for the past three weeks. The pt denies any other symptoms. The pt's iv was placed and the pt was medicated. Call bell placed on the pt's bed rail.
